# Patient Record
Sex: Male | Born: 1935 | Race: White | Hispanic: No | Marital: Married | Smoking: Never smoker
Health system: Southern US, Community
[De-identification: ages and names within clinical notes are randomized; demographics above are authoritative.]

## PROBLEM LIST (undated history)

## (undated) DIAGNOSIS — R413 Other amnesia: Secondary | ICD-10-CM

## (undated) DIAGNOSIS — H9190 Unspecified hearing loss, unspecified ear: Secondary | ICD-10-CM

## (undated) DIAGNOSIS — N200 Calculus of kidney: Secondary | ICD-10-CM

## (undated) DIAGNOSIS — M4807 Spinal stenosis, lumbosacral region: Secondary | ICD-10-CM

## (undated) DIAGNOSIS — F0280 Dementia in other diseases classified elsewhere without behavioral disturbance: Secondary | ICD-10-CM

## (undated) DIAGNOSIS — G309 Alzheimer's disease, unspecified: Secondary | ICD-10-CM

## (undated) DIAGNOSIS — M1990 Unspecified osteoarthritis, unspecified site: Secondary | ICD-10-CM

## (undated) DIAGNOSIS — N400 Enlarged prostate without lower urinary tract symptoms: Secondary | ICD-10-CM

## (undated) HISTORY — DX: Benign prostatic hyperplasia without lower urinary tract symptoms: N40.0

## (undated) HISTORY — PX: LITHOTRIPSY: SUR834

## (undated) HISTORY — DX: Unspecified hearing loss, unspecified ear: H91.90

## (undated) HISTORY — PX: CATARACT EXTRACTION: SUR2

## (undated) HISTORY — DX: Calculus of kidney: N20.0

## (undated) HISTORY — DX: Other amnesia: R41.3

## (undated) HISTORY — PX: BACK SURGERY: SHX140

## (undated) HISTORY — DX: Spinal stenosis, lumbosacral region: M48.07

## (undated) HISTORY — PX: POLYPECTOMY: SHX149

---

## 2001-01-07 ENCOUNTER — Other Ambulatory Visit: Admission: RE | Admit: 2001-01-07 | Discharge: 2001-01-07 | Payer: Self-pay | Admitting: Dermatology

## 2001-09-13 ENCOUNTER — Ambulatory Visit (HOSPITAL_COMMUNITY): Admission: RE | Admit: 2001-09-13 | Discharge: 2001-09-13 | Payer: Self-pay | Admitting: Internal Medicine

## 2003-04-20 ENCOUNTER — Other Ambulatory Visit: Admission: RE | Admit: 2003-04-20 | Discharge: 2003-04-20 | Payer: Self-pay | Admitting: Dermatology

## 2004-02-05 ENCOUNTER — Ambulatory Visit (HOSPITAL_COMMUNITY): Admission: RE | Admit: 2004-02-05 | Discharge: 2004-02-05 | Payer: Self-pay | Admitting: Pulmonary Disease

## 2004-03-13 ENCOUNTER — Ambulatory Visit (HOSPITAL_COMMUNITY): Admission: RE | Admit: 2004-03-13 | Discharge: 2004-03-13 | Payer: Self-pay | Admitting: Family Medicine

## 2004-04-16 ENCOUNTER — Ambulatory Visit (HOSPITAL_COMMUNITY): Admission: RE | Admit: 2004-04-16 | Discharge: 2004-04-16 | Payer: Self-pay | Admitting: Family Medicine

## 2004-04-25 ENCOUNTER — Ambulatory Visit (HOSPITAL_COMMUNITY): Admission: RE | Admit: 2004-04-25 | Discharge: 2004-04-25 | Payer: Self-pay | Admitting: Urology

## 2005-07-21 ENCOUNTER — Emergency Department (HOSPITAL_COMMUNITY): Admission: EM | Admit: 2005-07-21 | Discharge: 2005-07-21 | Payer: Self-pay | Admitting: Emergency Medicine

## 2005-07-23 ENCOUNTER — Ambulatory Visit (HOSPITAL_COMMUNITY): Admission: RE | Admit: 2005-07-23 | Discharge: 2005-07-23 | Payer: Self-pay | Admitting: Emergency Medicine

## 2005-08-07 ENCOUNTER — Inpatient Hospital Stay (HOSPITAL_COMMUNITY): Admission: RE | Admit: 2005-08-07 | Discharge: 2005-08-08 | Payer: Self-pay | Admitting: Neurosurgery

## 2006-04-05 ENCOUNTER — Emergency Department (HOSPITAL_COMMUNITY): Admission: EM | Admit: 2006-04-05 | Discharge: 2006-04-06 | Payer: Self-pay | Admitting: Emergency Medicine

## 2006-04-06 ENCOUNTER — Ambulatory Visit (HOSPITAL_COMMUNITY): Admission: RE | Admit: 2006-04-06 | Discharge: 2006-04-06 | Payer: Self-pay | Admitting: Internal Medicine

## 2006-04-08 ENCOUNTER — Ambulatory Visit (HOSPITAL_COMMUNITY): Admission: RE | Admit: 2006-04-08 | Discharge: 2006-04-08 | Payer: Self-pay | Admitting: Internal Medicine

## 2007-12-23 ENCOUNTER — Ambulatory Visit (HOSPITAL_COMMUNITY): Admission: RE | Admit: 2007-12-23 | Discharge: 2007-12-23 | Payer: Self-pay | Admitting: Family Medicine

## 2008-07-04 ENCOUNTER — Ambulatory Visit (HOSPITAL_COMMUNITY): Admission: RE | Admit: 2008-07-04 | Discharge: 2008-07-04 | Payer: Self-pay | Admitting: Ophthalmology

## 2010-09-23 LAB — BASIC METABOLIC PANEL
BUN: 15 mg/dL (ref 6–23)
CO2: 29 mEq/L (ref 19–32)
Calcium: 9.2 mg/dL (ref 8.4–10.5)
Chloride: 103 mEq/L (ref 96–112)
Creatinine, Ser: 0.95 mg/dL (ref 0.4–1.5)
GFR calc Af Amer: >60 mL/min (ref >60)
GFR calc non Af Amer: >60 mL/min (ref >60)
Glucose, Bld: 95 mg/dL (ref 70–99)
Potassium: 4.6 mEq/L (ref 3.5–5.1)
Sodium: 139 mEq/L (ref 135–145)

## 2010-09-23 LAB — HEMOGLOBIN AND HEMATOCRIT, BLOOD
HCT: 42 % (ref 39.0–52.0)
Hemoglobin: 14 g/dL (ref 13.0–17.0)

## 2010-10-25 NOTE — Op Note (Signed)
Noah Bailey, Noah Bailey             ACCOUNT NO.:  [id number]   MEDICAL RECORD NO.:  [id number]          PATIENT TYPE:  INP   LOCATION:  3008                         FACILITY:  [facility]   PHYSICIAN:  Hewitt Shorts, M.D.DATE OF BIRTH:  10-04-1928   DATE OF PROCEDURE:  08/07/2005  DATE OF DISCHARGE:                                 OPERATIVE REPORT   PREOPERATIVE DIAGNOSIS:  C6-7 cervical disk herniation, cervical  spondylosis, cervical degenerative disk disease, and cervical radiculopathy.   POSTOPERATIVE DIAGNOSIS:  C6-7 cervical disk herniation, cervical  spondylosis, cervical degenerative disk disease, and cervical radiculopathy.   OPERATION PERFORMED:  C6-7 anterior cervical diskectomy and arthrodesis with  VG2 allograft and tether cervical plating.   SURGEON:  Hewitt Shorts, M.D.   ASSISTANT:  Stefani Dama, M.D.   ANESTHESIA:  General endotracheal.   INDICATIONS FOR PROCEDURE:  The patient is a 77 year old man, who presented  with an acute right C7 cervical radiculopathy with right triceps and  intrinsic weakness.  MRI scan revealed multilevel degenerative disk disease  and spondylosis but with a right C6-7 soft cervical disk herniation.  Decision was made to proceed with a single level anterior cervical  diskectomy and arthrodesis.   DESCRIPTION OF PROCEDURE:  The patient was brought to the operating room and  placed under general endotracheal anesthesia.  The patient was placed in 10  pounds of halter traction.  The neck was prepped with Betadine soap and  solution and draped in sterile fashion.  A horizontal incision was made on  the left side of the neck.  The line of the incision was infiltrated with  local anesthetic and epinephrine.  Incision was made and was carried down  through the subcutaneous tissue and platysma with bipolar cautery and  electrocautery used to maintain hemostasis.  Dissection was carried down to  an avascular plane, leaving the  sternocleidomastoid, carotid artery and  jugular vein laterally and trachea and esophagus medially.  Then the ventral  aspect of the vertebral column identified and a localizing x-ray was taken.  The C6-7 intervertebral disk space was identified.  The anterior aspect of  the annulus was incised and the entry to the disk space proceeded with a  thorough diskectomy using  variety of pituitary rongeurs and microcurets.  The cartilaginous end plates of the corresponding vertebrae were removed  using microcurets and the X-Max drill.  Then the microscope was draped and  brought into the field to provide additional magnification, illumination and  visualization and the remainder of the decompression was performed using  microdissection and microsurgical technique. Posterior osteophytic  overgrowth was removed using the X-Max drill and a 2 mm Kerrison punch with  a thin foot plate.  The posterior longitudinal ligament which was thickened  was carefully removed and the disk herniation on the right side at C6-7 was  removed.  We decompressed the spinal canal and thecal sac as well as the  foramina and exiting nerve roots.  Once the decompression was completed,  hemostasis was established with the use of Gelfoam soaked in Thrombin.  We  did remove all the  Gelfoam, though, prior to proceeding with the  arthrodesis.  We measured the height of the intervertebral disk space using  disk space sizers and selected an 8 mm VG2 allograft.  It was hydrated in  saline solution and positioned in the intervertebral disk space and  countersunk.  We then selected a 16 mm tether cervical plate and it was  secured to each of the vertebrae with a pair of 4.0 x 14 mm screws.  Each  screw hole was drilled and tapped and the screws were placed in alternating  fashion. Final tightening was done of all four screws and then an x-ray was  taken which showed the screws in good position at C6 as well as the graft in  good  position at the C6-7 level.  Because of his shoulders, we could not  visualize the C7 level.  The wound was irrigated with bacitracin solution as  it had been throughout the procedure and we confirmed hemostasis and then  proceeded with closure.  The platysma was closed with interrupted inverted 2-  0 undyed Vicryl sutures.  Subcutaneous and subcuticular layer were closed  with interrupted inverted 3-0 undyed Vicryl sutures and the skin edges were  approximated with DermaBond.  The patient tolerated the procedure well.  The  estimated blood loss was 50 mL.  Sponge, needle and instrument counts were  correct.  Following surgery, the patient was placed in a soft cervical  collar to be reversed from his anesthetic, extubated and transferred to the  recovery room for further care.      Hewitt Shorts, M.D.  Electronically Signed     RWN/MEDQ  D:  08/07/2005  T:  08/08/2005  Job:  [id number]

## 2010-10-25 NOTE — Procedures (Signed)
Noah Bailey, Noah Bailey             ACCOUNT NO.:  [id number]   MEDICAL RECORD NO.:  [id number]          PATIENT TYPE:  OUT   LOCATION:  RAD                           FACILITY:  [facility]   PHYSICIAN:  Dani Gobble, MD       DATE OF BIRTH:  [birth year]   DATE OF PROCEDURE:  04/08/2006  DATE OF DISCHARGE:                                  ECHOCARDIOGRAM   REFERRING:  1. Dr. Sherwood Gambler.  2. Dr. Domingo Sep.   INDICATIONS:  A 77 year old gentleman with a past medical history of double  vision and possible stroke, who is referred for evaluation of LV function.   FINDINGS:  The technical quality of the study is reasonable.   The aorta measures normally at 3.2 cm.   The left atrium is mildly dilated and measured 4.3 cm.  The patient appeared  to be in sinus rhythm during the procedure.  No obvious clots or masses were  appreciated.   The interventricular septum measures 1.6 cm and the posterior wall measures  4.2 cm.   The aortic valve is trileaflet and mildly thickened, but without limitation  to leaflet excursion.  Mild aortic insufficiency is noted.  Doppler  interrogation of the aortic valve reveals a peak velocity of 1.6 meters per  second, corresponding to a peak gradient of 10 mmHg and a mean gradient of 6  mmHg and an area by Doppler of 2.7 cm2.   The mitral valve also appears mildly thickened, but without limitation to  leaflet excursion.  No mitral valve prolapse is noted.  Mild mitral  regurgitation is noted.  Doppler interrogation of mitral valve is within  normal limits.   The pulmonic valve is poorly visualized.   The tricuspid valve appears grossly structurally normal with mild to  moderate tricuspid regurgitation noted.  Doppler interrogation of the  tricuspid regurgitation does not suggest pulmonary hypertension.   The left ventricle is normal in size with LVIDd measured at 3.7 cm and LVISd  measured at 2.7 cm.  Overall left systolic function is normal and no  regional wall  motion abnormalities are noted.  There is basal septal  hypertrophy, as is common in the elderly, as an overlay to his concentric  LVH.  There does not appear to be a resting LVOT gradient.  The presence of  diastolic dysfunction is inferred from pulse wave Doppler across the mitral  valve.   The right atrium appears normal in size while the right ventricle is  moderate to markedly dilated with preserved right ventricular systolic  function.   IMPRESSION:  1. Mild left atrial enlargement.  2. Mild concentric left ventricular hypertrophy with additional basal      septal hypertrophy overlay.  No resting left ventricular outflow tract      gradient is appreciated.  3. Mild aortic sclerosis without stenosis.  4. Mild aortic insufficiency.  5. Mild mitral regurgitation.  6. Mild to moderate tricuspid regurgitation without evidence for pulmonary      hypertension.  7. Normal left ventricular size and systolic function without regional      wall motion abnormality  noted.  8. Presence of diastolic dysfunction is inferred from pulse wave Doppler      across the mitral valve.  9. Moderate to marked right ventricular enlargement with preserved right      ventricular systolic function.           ______________________________  Dani Gobble, MD     AB/MEDQ  D:  04/08/2006  T:  04/09/2006  Job:  [id number]   cc:   Madelin Rear. Sherwood Gambler, MD  Fax: [phone]

## 2011-01-26 ENCOUNTER — Other Ambulatory Visit: Payer: Self-pay

## 2011-01-26 ENCOUNTER — Encounter: Payer: Self-pay | Admitting: Emergency Medicine

## 2011-01-26 ENCOUNTER — Emergency Department (HOSPITAL_COMMUNITY): Payer: Medicare Other

## 2011-01-26 ENCOUNTER — Emergency Department (HOSPITAL_COMMUNITY)
Admission: EM | Admit: 2011-01-26 | Discharge: 2011-01-26 | Disposition: A | Discharge status: Home / Self Care | Payer: Medicare Other | Attending: Emergency Medicine | Admitting: Emergency Medicine

## 2011-01-26 DIAGNOSIS — H8110 Benign paroxysmal vertigo, unspecified ear: Secondary | ICD-10-CM | POA: Insufficient documentation

## 2011-01-26 HISTORY — DX: Unspecified osteoarthritis, unspecified site: M19.90

## 2011-01-26 LAB — BASIC METABOLIC PANEL
BUN: 13 mg/dL (ref 6–23)
CO2: 27 mEq/L (ref 19–32)
Chloride: 104 mEq/L (ref 96–112)
GFR calc Af Amer: >60 mL/min (ref >60)
Glucose, Bld: 135 mg/dL — ABNORMAL HIGH (ref 70–99)
Potassium: 3.6 mEq/L (ref 3.5–5.1)

## 2011-01-26 LAB — URINALYSIS, ROUTINE W REFLEX MICROSCOPIC
Bilirubin Urine: NEGATIVE
Hgb urine dipstick: NEGATIVE
Nitrite: NEGATIVE
Specific Gravity, Urine: 1.02 (ref 1.005–1.030)
pH: 7.5 (ref 5.0–8.0)

## 2011-01-26 LAB — CBC
HCT: 37.9 % — ABNORMAL LOW (ref 39.0–52.0)
Hemoglobin: 12.9 g/dL — ABNORMAL LOW (ref 13.0–17.0)
MCH: 31.4 pg (ref 26.0–34.0)
MCHC: 34 g/dL (ref 30.0–36.0)
MCV: 92.2 fL (ref 78.0–100.0)
Platelets: 189 10*3/uL (ref 150–400)
RBC: 4.11 MIL/uL — ABNORMAL LOW (ref 4.22–5.81)
RDW: 13.4 % (ref 11.5–15.5)
WBC: 4.3 10*3/uL (ref 4.0–10.5)

## 2011-01-26 LAB — DIFFERENTIAL
Basophils Absolute: 0 10*3/uL (ref 0.0–0.1)
Basophils Relative: 0 % (ref 0–1)
Eosinophils Absolute: 0 10*3/uL (ref 0.0–0.7)
Eosinophils Relative: 0 % (ref 0–5)
Lymphocytes Relative: 20 % (ref 12–46)
Lymphs Abs: 0.8 10*3/uL (ref 0.7–4.0)
Monocytes Absolute: 0.2 10*3/uL (ref 0.1–1.0)
Monocytes Relative: 4 % (ref 3–12)
Neutro Abs: 3.2 10*3/uL (ref 1.7–7.7)
Neutrophils Relative %: 76 % (ref 43–77)

## 2011-01-26 MED ORDER — MECLIZINE HCL 12.5 MG PO TABS
12.5000 mg | ORAL_TABLET | Freq: Once | ORAL | Status: AC
  Administered 2011-01-26: 12.5 mg via ORAL
  Filled 2011-01-26: qty 1

## 2011-01-26 NOTE — ED Notes (Signed)
Orthostatic Vital Signs: Completed by EMS PTA Lying:  BP: 120/90        Sitting: BP: 150/80        Standing:  BP: 154/90

## 2011-01-26 NOTE — ED Provider Notes (Signed)
[id number] job #

## 2011-01-26 NOTE — ED Notes (Signed)
Meal tray ordered for patient.

## 2011-01-26 NOTE — ED Notes (Signed)
Patient placed on continuous cardiac monitoring, continuous pulse oximetry, and NBP cycling q 1 hour.   

## 2011-01-26 NOTE — ED Notes (Signed)
Orthostatic Vital Signs:  Lying:  BP: 133/77        Pulse: 74  Sitting: BP: 147/72       Pulse: 79  Standing:  BP: 144/66          Pulse: 82

## 2011-01-26 NOTE — ED Notes (Signed)
Patient arrives via EMS with c/o dizziness that started at approximately at 0400. Patient also reporting LLQ abdominal pain that started yesterday evening. Denies nausea/vomiting. Patient alert/oriented x 4.

## 2011-01-26 NOTE — ED Notes (Signed)
Patient with no complaints at this time. Respirations even and unlabored. Skin warm/dry. Discharge instructions reviewed with patient at this time. Patient given opportunity to voice concerns/ask questions. IV removed per policy and band-aid applied to site. Patient discharged at this time and left Emergency Department with steady gait.  

## 2011-01-26 NOTE — Progress Notes (Signed)
Noah Bailey, Noah Bailey             ACCOUNT NO.:  [id number]  MEDICAL RECORD NO.:  [id number]  LOCATION:  APA14                         FACILITY:  [facility]  PHYSICIAN:  Mila Homer. [name], M.D.DATE OF BIRTH:  June 18, 1928  DATE OF PROCEDURE: DATE OF DISCHARGE:  01/26/2011                                PROGRESS NOTE   SUBJECTIVE:  This 77 year old woke up last night with the room spinning around him as he went to the bathroom.  Things stabilized somewhat once he was up, but he was still dizzy and had to hold onto the walls to avoid falling.  He presented to the [facility] Emergency Room today for evaluation.  He has not had these symptoms before.  He is currently on Flomax 0.4 mg daily but has been on this drug for years without a problem.  He tends to run low blood pressure.  When the ambulance came out to his house, they ran postural signs which by history were normal.  He has had no weakness, no problems with his speech and no problems with swallowing.  He is generally healthy and is followed by Dr. Shaune Pollack, his local MD.  OBJECTIVE:  VITAL SIGNS:  His temperature is 97.7, pulse 70, rest rate 18, blood pressure 127/78, O2 saturation 95%. GENERAL:  He is supine in bed.  His speech is normal, and he is oriented and alert.  His wife and son are with him in the room in the emergency department.  His color is good. HEART:  His heart has a regular rhythm without murmur, rate of about 80. LUNGS:  His lungs are clear throughout.  He is moving air well. NEUROLOGIC:  He has 5/5 grip strength bilaterally.  His smile is symmetrical.  He has bilateral hearing aids, but I removed these and examined his ear canals and drums.  Both of these were normal.  Good light reflexes.  No cerumen.  Again, his speech was totally normal.  A CT of his brain showed no acute intracranial pathology but chronic ischemic changes and mild global atrophy appropriate to age.  He had minimal  mucus material in the left maxillary sinus.  His white cell count is 4300, hemoglobin 12.9.  CMP was normal.  Glucose is 135.  UA negative.  I went to sit him up, which is how he tends to get dizzy, and after meclizine he seemed to be doing better.  He had no vertical or horizontal nystagmus noted.  ASSESSMENT: 1. Vertigo. 2. Question transient ischemic attack versus cerebellar infarct. 3. Benign prostatic hypertrophy.  PLAN:  I discussed all this with his wife and the patient at length.  He really would like to go home and feels much better at this point.  I wrote a prescription for meclizine 25 mg (1/2 to 1 tablet q.i.d.) #30 with 1 refill to use today and tomorrow.  He is to call the office of Dr. Juanetta Gosling tomorrow to get follow-up there.  If his symptoms worsen, he may need further radiological evaluation including MRI of the brain.  We discussed other causes of dizziness such as benign positional vertigo.     Mila Homer. [name], M.D.  SDK/MEDQ  D:  01/26/2011  T:  01/26/2011  Job:  [id number]

## 2011-01-26 NOTE — ED Provider Notes (Signed)
History     CSN: [id number] Arrival date & time: 01/26/2011  8:53 AM  Chief Complaint  Patient presents with  . Dizziness   Patient is a 77 y.o. male presenting with neurologic complaint. The history is provided by the patient and the spouse.  Neurologic Problem The primary symptoms include dizziness. Primary symptoms do not include headaches, fever or nausea. The symptoms began 2 to 6 hours ago. The symptoms are improving. The neurological symptoms are diffuse. The symptoms occurred after standing up.  Dizziness does not occur with tinnitus, nausea or weakness.  Additional symptoms include loss of balance. Additional symptoms do not include neck stiffness, weakness, leg pain, photophobia, nystagmus or tinnitus.    Past Medical History  Diagnosis Date  . Arthritis     Past Surgical History  Procedure Date  . Polypectomy     History reviewed. No pertinent family history.  History  Substance Use Topics  . Smoking status: Never Smoker   . Smokeless tobacco: Not on file  . Alcohol Use: No      Review of Systems  Constitutional: Negative for fever.  HENT: Negative for congestion, sore throat, neck pain, neck stiffness and tinnitus.   Eyes: Negative.  Negative for photophobia.  Respiratory: Negative for chest tightness and shortness of breath.   Cardiovascular: Negative for chest pain.  Gastrointestinal: Negative for nausea and abdominal pain.  Genitourinary: Negative.   Musculoskeletal: Negative for joint swelling and arthralgias.  Skin: Negative.  Negative for rash and wound.  Neurological: Positive for dizziness and loss of balance. Negative for weakness, light-headedness, numbness and headaches.  Hematological: Negative.   Psychiatric/Behavioral: Negative.     Physical Exam  BP 127/78  Pulse 70  Temp(Src) 97.7 F (36.5 C) (Oral)  Resp 18  Ht 5\' 10"  (1.778 m)  Wt 190 lb (86.183 kg)  BMI 27.26 kg/m2  SpO2 95%  Physical Exam  Nursing note and vitals  reviewed. Constitutional: He is oriented to person, place, and time. He appears well-developed and well-nourished.  HENT:  Head: Normocephalic and atraumatic.  Mouth/Throat: Oropharynx is clear and moist.  Eyes: EOM are normal. Pupils are equal, round, and reactive to light.  Neck: Normal range of motion. Neck supple.  Cardiovascular: Normal rate and normal heart sounds.   Pulmonary/Chest: Effort normal.  Abdominal: Soft. There is no tenderness.  Musculoskeletal: Normal range of motion.  Lymphadenopathy:    He has no cervical adenopathy.  Neurological: He is alert and oriented to person, place, and time. He has normal strength. No cranial nerve deficit or sensory deficit. He displays a negative Romberg sign. Coordination normal. GCS eye subscore is 4. GCS verbal subscore is 5. GCS motor subscore is 6.       Normal heel-shin, normal rapid alternating movements.  Skin: Skin is warm and dry. No rash noted.  Psychiatric: He has a normal mood and affect. His speech is normal and behavior is normal. Thought content normal. Cognition and memory are normal.    ED Course  Procedures  MDM Discussed with Dr. [name] who suggested trial of meclizine.  Will see in ed in consult.  Meclizine ordered.  Evaluated by Dr. [name] who has decided to dc pt home with close f/u.  Prescription for meclizine given pt by Dr [name].   Date: 01/26/2011  Rate: 72  Rhythm: normal sinus rhythm  QRS Axis: left  Intervals: normal  ST/T Wave abnormalities: nonspecific ST/T changes  Conduction Disutrbances:none  Narrative Interpretation: lvh.  Q waves in inferior  leads unchanged  Old EKG Reviewed: No sig changes noted.        Candis Musa, PA 01/26/11 1606  Medical screening examination/treatment/procedure(s) were conducted as a shared visit with non-physician practitioner(s) and myself.  I personally evaluated the patient during the encounter Pt seen and examined, pt with dizziness improving upon my  evaluation.  Ambulated to bathroom without difficulty.  Dr. [name] has seen patient in the ED, gave prescription for meclizine and has discharge. Will see patient in office for follow up.   Ethelda Chick, MD 01/26/11 [phone]

## 2011-01-26 NOTE — ED Notes (Signed)
Urine specimen obtained at this time.

## 2011-01-26 NOTE — ED Notes (Signed)
Patient reports dizziness symptoms were so he couldn't get out of bed this morning. Patient denies cardiac history.

## 2011-03-27 ENCOUNTER — Other Ambulatory Visit (HOSPITAL_COMMUNITY): Payer: Self-pay | Admitting: Pulmonary Disease

## 2011-03-27 ENCOUNTER — Ambulatory Visit (HOSPITAL_COMMUNITY)
Admission: RE | Admit: 2011-03-27 | Discharge: 2011-03-27 | Disposition: A | Discharge status: Home / Self Care | Payer: Medicare Other | Source: Ambulatory Visit | Attending: Pulmonary Disease | Admitting: Pulmonary Disease

## 2011-03-27 DIAGNOSIS — M25469 Effusion, unspecified knee: Secondary | ICD-10-CM | POA: Insufficient documentation

## 2011-03-27 DIAGNOSIS — M25562 Pain in left knee: Secondary | ICD-10-CM

## 2011-03-27 DIAGNOSIS — M25569 Pain in unspecified knee: Secondary | ICD-10-CM | POA: Insufficient documentation

## 2011-04-02 ENCOUNTER — Other Ambulatory Visit (HOSPITAL_COMMUNITY): Payer: Self-pay | Admitting: Pulmonary Disease

## 2011-04-02 DIAGNOSIS — R413 Other amnesia: Secondary | ICD-10-CM

## 2011-04-03 ENCOUNTER — Ambulatory Visit (HOSPITAL_COMMUNITY)
Admission: RE | Admit: 2011-04-03 | Discharge: 2011-04-03 | Disposition: A | Discharge status: Home / Self Care | Payer: Medicare Other | Source: Ambulatory Visit | Attending: Pulmonary Disease | Admitting: Pulmonary Disease

## 2011-04-03 DIAGNOSIS — G319 Degenerative disease of nervous system, unspecified: Secondary | ICD-10-CM | POA: Insufficient documentation

## 2011-04-03 DIAGNOSIS — R413 Other amnesia: Secondary | ICD-10-CM | POA: Insufficient documentation

## 2011-04-29 ENCOUNTER — Other Ambulatory Visit (HOSPITAL_COMMUNITY): Payer: Self-pay | Admitting: Urology

## 2011-04-29 ENCOUNTER — Ambulatory Visit (HOSPITAL_COMMUNITY)
Admission: RE | Admit: 2011-04-29 | Discharge: 2011-04-29 | Disposition: A | Discharge status: Home / Self Care | Payer: Medicare Other | Source: Ambulatory Visit | Attending: Urology | Admitting: Urology

## 2011-04-29 DIAGNOSIS — R109 Unspecified abdominal pain: Secondary | ICD-10-CM | POA: Insufficient documentation

## 2011-04-29 DIAGNOSIS — N200 Calculus of kidney: Secondary | ICD-10-CM

## 2011-06-11 ENCOUNTER — Ambulatory Visit (HOSPITAL_COMMUNITY)
Admission: RE | Admit: 2011-06-11 | Discharge: 2011-06-11 | Disposition: A | Discharge status: Home / Self Care | Payer: Medicare Other | Source: Ambulatory Visit | Attending: Urology | Admitting: Urology

## 2011-06-11 ENCOUNTER — Other Ambulatory Visit (HOSPITAL_COMMUNITY): Payer: Self-pay | Admitting: Urology

## 2011-06-11 DIAGNOSIS — N200 Calculus of kidney: Secondary | ICD-10-CM

## 2011-06-11 DIAGNOSIS — Z09 Encounter for follow-up examination after completed treatment for conditions other than malignant neoplasm: Secondary | ICD-10-CM | POA: Insufficient documentation

## 2011-06-19 ENCOUNTER — Encounter: Payer: Self-pay | Admitting: Orthopedic Surgery

## 2011-06-19 ENCOUNTER — Ambulatory Visit (INDEPENDENT_AMBULATORY_CARE_PROVIDER_SITE_OTHER): Payer: Medicare Other | Admitting: Orthopedic Surgery

## 2011-06-19 VITALS — BP 130/70 | Ht 70.0 in | Wt 194.0 lb

## 2011-06-19 DIAGNOSIS — M1710 Unilateral primary osteoarthritis, unspecified knee: Secondary | ICD-10-CM

## 2011-06-19 NOTE — Progress Notes (Signed)
Patient ID: Noah Bailey, male   DOB: 1929-05-18, 78 y.o.   MRN: [id number] x Subjective:    Noah Bailey is a 78 y.o. male who presents with knee pain involving the left knee. Onset was gradual. Inciting event: none known. Current symptoms include: pain located medial joint line, stiffness and swelling. Pain is aggravated by any weight bearing, going up and down stairs, rising after sitting, standing, walking and general activity. Patient has had no prior knee problems. Evaluation to date: plain films: degenerative joint disease mild to moderate. Treatment to date: none.  The following portions of the patient's history were reviewed and updated as appropriate: allergies, current medications, past family history, past medical history, past social history, past surgical history and problem list.   Review of Systems A comprehensive review of systems was negative except for: blood and urine related to her recent kidney stones   Objective:    BP 130/70  Ht 5\' 10"  (1.778 m)  Wt 194 lb (87.998 kg)  BMI 27.84 kg/m2  Physical Exam(12) GENERAL: normal development   CDV: pulses are normal   Skin: normal  Lymph: nodes were not palpable/normal  Psychiatric: awake, alert and oriented  Neuro: normal sensation  Upper extremity exam  Inspection and palpation revealed no abnormalities in the upper extremities.  Range of motion is full without contracture.  Motor exam is normal with grade 5 strength.  The joints are fully reduced without subluxation.  There is no atrophy or tremor and muscle tone is normal.  All joints are stable.   Right knee: normal and no effusion, full active range of motion, no joint line tenderness, ligamentous structures intact.  Left knee:  positive exam findings: medial joint line tenderness, tenderness noted under the patella at the patellar tendon and along the patellar tendon and negative exam findings: ACL stable, PCL stable, MCL stable, LCL stable, no  patellar laxity, McMurray's negative and hyperflexion test caused some pain but it was anterior.  Screw test was normal.  McMurray sign was in between.   X-ray left knee: shows DJD changes, likely chronic    Assessment:    Right arthritis    Plan:    OTC analgesics as needed. Arthrocentesis. See procedure note.   Knee  Injection Procedure Note  Pre-operative Diagnosis: left knee oa djd  Post-operative Diagnosis: same  Indications: pain   Anesthesia: not required none  Procedure Details   Verbal consent was obtained for the procedure. Time out was completed.The joint was prepped with alcohol, followed by  Ethyl chloride spray and A 20 gauge needle was inserted into the knee via lateral approach; 4ml 1% lidocaine and 1 ml of depomedrol  was then injected into the joint . The needle was removed and the area cleansed and dressed.  Complications:  None; patient tolerated the procedure well.  Complications:  None; patient tolerated the procedure well.

## 2011-06-19 NOTE — Patient Instructions (Signed)
You have received a steroid shot. 15% of patients experience increased pain at the injection site with in the next 24 hours. This is best treated with ice and tylenol extra strength 2 tabs every 8 hours. If you are still having pain please call the office.   For pain take 2 extra sterngth tylenol 3 x a day

## 2011-07-03 ENCOUNTER — Ambulatory Visit: Payer: Medicare Other | Admitting: Orthopedic Surgery

## 2011-07-18 ENCOUNTER — Telehealth: Payer: Self-pay | Admitting: *Deleted

## 2011-07-20 NOTE — Telephone Encounter (Signed)
Call in ultracet 1 q 6 hrs prn pain   # 40 2 refills

## 2011-07-21 MED ORDER — TRAMADOL-ACETAMINOPHEN 37.5-325 MG PO TABS: 1.0000 | ORAL_TABLET | Freq: Four times a day (QID) | ORAL | Status: AC | PRN

## 2011-07-21 NOTE — Telephone Encounter (Signed)
Medication sent, patient aware.  

## 2011-09-30 ENCOUNTER — Ambulatory Visit (INDEPENDENT_AMBULATORY_CARE_PROVIDER_SITE_OTHER): Payer: Medicare Other | Admitting: Orthopedic Surgery

## 2011-09-30 ENCOUNTER — Encounter: Payer: Self-pay | Admitting: Orthopedic Surgery

## 2011-09-30 VITALS — BP 124/70 | Ht 70.0 in | Wt 193.0 lb

## 2011-09-30 DIAGNOSIS — M6530 Trigger finger, unspecified finger: Secondary | ICD-10-CM | POA: Insufficient documentation

## 2011-09-30 DIAGNOSIS — M1710 Unilateral primary osteoarthritis, unspecified knee: Secondary | ICD-10-CM

## 2011-09-30 NOTE — Progress Notes (Signed)
Patient ID: Noah Bailey, male   DOB: 1929-04-10, 78 y.o.   MRN: [id number] Chief Complaint  Patient presents with  . Knee Pain    left knee pain, requests injection, also left ring finger trigger finger   Problem is triggering of the LEFT ring finger.  On problem of osteoarthritis, LEFT knee. Last evaluated January of 2013  Complains of triggering of the LEFT long finger with locking and catching and pain over the A1 pulley.  The knee has improved 100% but he would like another injection to get rid of the last visit. The pain that he is having.  His review of systems is negative.  The LEFT ring finger is tender over the A1 pulley full flexion is noted. No weakness is noted. The skins intact. There is no rash. There is no redness. There is catching no locking. There is swelling over the. The pulse and temperature of the digit are normal.  Problem #1 A1 pulley/tenosynovitis with triggering.  Recommend injection.  Problem #2 osteoarthritis of the LEFT knee.  Recommend injection

## 2011-09-30 NOTE — Patient Instructions (Signed)
You have received a steroid shot. 15% of patients experience increased pain at the injection site with in the next 24 hours. This is best treated with ice and tylenol extra strength 2 tabs every 8 hours. If you are still having pain please call the office.    

## 2012-08-05 ENCOUNTER — Other Ambulatory Visit (HOSPITAL_COMMUNITY): Payer: Self-pay | Admitting: Pulmonary Disease

## 2012-08-05 DIAGNOSIS — I739 Peripheral vascular disease, unspecified: Secondary | ICD-10-CM

## 2012-08-06 ENCOUNTER — Ambulatory Visit (HOSPITAL_COMMUNITY)
Admission: RE | Admit: 2012-08-06 | Discharge: 2012-08-06 | Disposition: A | Discharge status: Home / Self Care | Payer: Medicare Other | Source: Ambulatory Visit | Attending: Pulmonary Disease | Admitting: Pulmonary Disease

## 2012-08-06 DIAGNOSIS — I739 Peripheral vascular disease, unspecified: Secondary | ICD-10-CM | POA: Insufficient documentation

## 2012-12-14 ENCOUNTER — Ambulatory Visit: Payer: Medicare Other | Admitting: Orthopedic Surgery

## 2012-12-14 ENCOUNTER — Ambulatory Visit (INDEPENDENT_AMBULATORY_CARE_PROVIDER_SITE_OTHER): Payer: Medicare Other | Admitting: Orthopedic Surgery

## 2012-12-14 VITALS — BP 122/76 | Ht 70.0 in | Wt 195.0 lb

## 2012-12-14 DIAGNOSIS — M1710 Unilateral primary osteoarthritis, unspecified knee: Secondary | ICD-10-CM

## 2012-12-14 NOTE — Progress Notes (Signed)
Patient ID: Noah Bailey, male   DOB: Aug 20, 1928, 79 y.o.   MRN: [id number] Chief Complaint  Patient presents with  . Follow-up    Recheck left knee. Requesting injection.    Knee  Injection Procedure Note  Pre-operative Diagnosis: left knee oa  Post-operative Diagnosis: same  Indications: pain  Anesthesia: ethyl chloride   Procedure Details   Verbal consent was obtained for the procedure. Time out was completed.The joint was prepped with alcohol, followed by  Ethyl chloride spray and A 20 gauge needle was inserted into the knee via lateral approach; 4ml 1% lidocaine and 1 ml of depomedrol  was then injected into the joint . The needle was removed and the area cleansed and dressed.  Complications:  None; patient tolerated the procedure well. Knee  Injection Procedure Note  Pre-operative Diagnosis: right knee oa  Post-operative Diagnosis: same  Indications: pain  Anesthesia: ethyl chloride   Procedure Details   Verbal consent was obtained for the procedure. Time out was completed.The joint was prepped with alcohol, followed by  Ethyl chloride spray and A 20 gauge needle was inserted into the knee via lateral approach; 4ml 1% lidocaine and 1 ml of depomedrol  was then injected into the joint . The needle was removed and the area cleansed and dressed.  Complications:  None; patient tolerated the procedure well.

## 2012-12-14 NOTE — Patient Instructions (Signed)
You have received a steroid shot. 15% of patients experience increased pain at the injection site with in the next 24 hours. This is best treated with ice and tylenol extra strength 2 tabs every 8 hours. If you are still having pain please call the office.    

## 2013-01-12 ENCOUNTER — Other Ambulatory Visit (HOSPITAL_COMMUNITY): Payer: Self-pay | Admitting: Pulmonary Disease

## 2013-01-12 DIAGNOSIS — R200 Anesthesia of skin: Secondary | ICD-10-CM

## 2013-01-12 DIAGNOSIS — M545 Low back pain: Secondary | ICD-10-CM

## 2013-01-18 ENCOUNTER — Ambulatory Visit (HOSPITAL_COMMUNITY)
Admission: RE | Admit: 2013-01-18 | Discharge: 2013-01-18 | Disposition: A | Discharge status: Home / Self Care | Payer: Medicare Other | Source: Ambulatory Visit | Attending: Pulmonary Disease | Admitting: Pulmonary Disease

## 2013-01-18 DIAGNOSIS — M545 Low back pain, unspecified: Secondary | ICD-10-CM | POA: Insufficient documentation

## 2013-01-18 DIAGNOSIS — R200 Anesthesia of skin: Secondary | ICD-10-CM

## 2013-01-18 DIAGNOSIS — M5126 Other intervertebral disc displacement, lumbar region: Secondary | ICD-10-CM | POA: Insufficient documentation

## 2013-01-18 DIAGNOSIS — M79609 Pain in unspecified limb: Secondary | ICD-10-CM | POA: Insufficient documentation

## 2013-06-21 ENCOUNTER — Encounter: Payer: Self-pay | Admitting: *Deleted

## 2013-06-22 ENCOUNTER — Encounter: Payer: Self-pay | Admitting: Neurology

## 2013-06-22 ENCOUNTER — Encounter (INDEPENDENT_AMBULATORY_CARE_PROVIDER_SITE_OTHER): Payer: Self-pay | Admitting: Diagnostic Neuroimaging

## 2013-06-22 ENCOUNTER — Encounter (INDEPENDENT_AMBULATORY_CARE_PROVIDER_SITE_OTHER): Payer: Self-pay

## 2013-06-22 ENCOUNTER — Ambulatory Visit (INDEPENDENT_AMBULATORY_CARE_PROVIDER_SITE_OTHER): Payer: Medicare Other | Admitting: Neurology

## 2013-06-22 VITALS — BP 140/81 | HR 102 | Ht 68.0 in | Wt 200.0 lb

## 2013-06-22 DIAGNOSIS — G63 Polyneuropathy in diseases classified elsewhere: Secondary | ICD-10-CM

## 2013-06-22 DIAGNOSIS — D518 Other vitamin B12 deficiency anemias: Secondary | ICD-10-CM

## 2013-06-22 DIAGNOSIS — G5600 Carpal tunnel syndrome, unspecified upper limb: Secondary | ICD-10-CM

## 2013-06-22 DIAGNOSIS — M48061 Spinal stenosis, lumbar region without neurogenic claudication: Secondary | ICD-10-CM

## 2013-06-22 DIAGNOSIS — R6889 Other general symptoms and signs: Secondary | ICD-10-CM

## 2013-06-22 DIAGNOSIS — Z0289 Encounter for other administrative examinations: Secondary | ICD-10-CM

## 2013-06-22 DIAGNOSIS — R209 Unspecified disturbances of skin sensation: Secondary | ICD-10-CM

## 2013-06-22 DIAGNOSIS — M4807 Spinal stenosis, lumbosacral region: Secondary | ICD-10-CM

## 2013-06-22 HISTORY — DX: Spinal stenosis, lumbosacral region: M48.07

## 2013-06-22 NOTE — Procedures (Signed)
     HISTORY:  Noah Bailey is an 80 year old gentleman with a history of back pain and numbness in the legs that dates back about 2 years. The patient has had some mild gait instability, and he is being evaluated for a possible peripheral neuropathy. Epidural injections in the back have helped the back pain, not the leg numbness.  NERVE CONDUCTION STUDIES:  Nerve conduction studies were performed on the right upper extremity. The distal motor latency for the right median nerve was prolonged, with a normal motor amplitude. The distal motor latency for the right ulnar nerve was borderline normal, with a normal motor amplitude. The F wave latencies and nerve conduction velocities for the right median and ulnar nerves were normal. The right median sensory latency is prolonged, normal for the right ulnar nerve.  Nerve conduction studies were performed on both lower extremities. The distal motor latencies for the peroneal nerves were normal bilaterally, with a low motor amplitude on the left, borderline normal on the right. Normal nerve conduction velocities for these nerves were seen. The distal motor latencies for the posterior tibial nerves were normal bilaterally, with low motor amplitudes for these nerves bilaterally. Nerve conduction velocities for these nerves were normal bilaterally. The peroneal sensory latencies were absent bilaterally. The H reflex latencies were absent bilaterally.  EMG STUDIES:  EMG study was performed on the right lower extremity:  The tibialis anterior muscle reveals 2 to 5K motor units with decreased recruitment. No fibrillations or positive waves were seen. The peroneus tertius muscle reveals 2 to 5K motor units with decreased recruitment. No fibrillations or positive waves were seen. The medial gastrocnemius muscle reveals 2 to 4K motor units with decreased recruitment. One plus fibrillations and positive waves were seen. The vastus lateralis muscle reveals 2 to  4K motor units with full recruitment. No fibrillations or positive waves were seen. The iliopsoas muscle reveals 2 to 4K motor units with full recruitment. No fibrillations or positive waves were seen. The biceps femoris muscle (long head) reveals 2 to 4K motor units with full recruitment. No fibrillations or positive waves were seen. The lumbosacral paraspinal muscles were tested at 3 levels, and revealed no abnormalities of insertional activity at all 3 levels tested. There was good relaxation.   IMPRESSION:  Nerve conduction studies done on the right upper extremity and both lower extremities shows evidence of a mild right carpal tunnel syndrome. 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 EMG evaluation of the right lower extremity shows distal primarily chronic signs of denervation that is consistent with the diagnosis of peripheral neuropathy. There does not appear to be evidence of an overlying lumbosacral radiculopathy on the right.  Jill Alexanders MD 06/22/2013 1:10 PM  [facility] [address] [place], [place] [address]  Phone [phone] Fax [phone]

## 2013-06-22 NOTE — Progress Notes (Signed)
Reason for visit: Numbness in the legs  Noah Bailey is a 80 y.o. male  History of present illness:  Mr. Noah Bailey is an 80 year old right-handed white male with a history of lumbosacral spinal stenosis at L4-5 level. The patient has moderate level spinal stenosis, and approximately 2 years ago he developed some back pain, and around the same time began having some numbness below the knees bilaterally. The patient believes there is some weakness of the legs, and he is had some mild gait instability but no falls. The patient is ambulating without any assistive device. The patient has gained some benefit with epidural steroid injections on 2 occasions with an improvement in the back pain. The numbness in the legs persists, however. The patient denies any problems controlling the bowels or the bladder. The patient denies any neck pain or pain down arms or numbness or weakness in arms. The patient is sent to this office for evaluation of a possible peripheral neuropathy. The patient indicates that the numbness has not worsened with walking, but his discomfort in the back does worsen when he is active.  Past Medical History  Diagnosis Date  . Arthritis   . Hearing deficit     right hearing aid  . Lumbosacral spinal stenosis 06/22/2013  . BPH (benign prostatic hyperplasia)   . Renal calculi   . Memory disturbance     Past Surgical History  Procedure Laterality Date  . Polypectomy    . Back surgery      Cervical spine  . Lithotripsy    . Cataract extraction Bilateral     History reviewed. No pertinent family history.  Social history:  reports that he has never smoked. He has never used smokeless tobacco. He reports that he does not drink alcohol or use illicit drugs.  Medications:  Current Outpatient Prescriptions on File Prior to Visit  Medication Sig Dispense Refill  . acetaminophen (TYLENOL) 500 MG tablet Take 500 mg by mouth every 6 (six) hours as needed. For pain       .  Multiple Vitamins-Minerals (OCUVITE ADULT 50+ PO) Take 1 tablet by mouth daily.        . Tamsulosin HCl (FLOMAX) 0.4 MG CAPS Take 0.4 mg by mouth daily.         No current facility-administered medications on file prior to visit.      Allergies  Allergen Reactions  . Ivp Dye [Iodinated Diagnostic Agents]     Unknown    ROS:  Out of a complete 14 system review of symptoms, the patient complains only of the following symptoms, and all other reviewed systems are negative.  Memory loss, numbness, weakness Low back pain  Blood pressure 140/81, pulse 102, height 5\' 8"  (1.727 m), weight 200 lb (90.719 kg).  Physical Exam  General: The patient is alert and cooperative at the time of the examination.  Eyes: Pupils are equal, round, and reactive to light. Discs are flat bilaterally.  Neck: The neck is supple, no carotid bruits are noted.  Respiratory: The respiratory examination is clear.  Cardiovascular: The cardiovascular examination reveals a regular rate and rhythm, no obvious murmurs or rubs are noted.  Skin: Extremities are without significant edema on the left, 1+ edema below the knee on the right.  Neurologic Exam  Mental status: The patient is alert and oriented x 3 at the time of the examination. The patient has apparent normal recent and remote memory, with an apparently normal attention span and concentration  ability.  Cranial nerves: Facial symmetry is present. There is good sensation of the face to pinprick and soft touch bilaterally. The strength of the facial muscles and the muscles to head turning and shoulder shrug are normal bilaterally. Speech is well enunciated, no aphasia or dysarthria is noted. Extraocular movements are full. Visual fields are full. The tongue is midline, and the patient has symmetric elevation of the soft palate. No obvious hearing deficits are noted.  Motor: The motor testing reveals 5 over 5 strength of all 4 extremities. Good symmetric motor  tone is noted throughout.  Sensory: Sensory testing is intact to pinprick, soft touch, vibration sensation, and position sense on all 4 extremities, with the exception that there is a stocking pattern pinprick sensory deficit up to the knees bilaterally, a moderate level of vibratory sensation impairment in the feet bilaterally. No evidence of extinction is noted.  Coordination: Cerebellar testing reveals good finger-nose-finger and heel-to-shin bilaterally.  Gait and station: Gait is normal. Tandem gait is unsteady. Romberg is negative. No drift is seen.  Reflexes: Deep tendon reflexes are symmetric and normal bilaterally, with the exception that the ankle jerk reflexes are absent bilaterally. Toes are downgoing bilaterally.   MRI lumbosacral spine 01/12/2013:  IMPRESSION:  1. Normal alignment and minimal degenerative changes for age.  2. Moderate hypertrophic facet disease.  3. Moderate multifactorial spinal and lateral recess stenosis at  L4-5.    Assessment/Plan:  One. Lumbosacral spinal stenosis  2. Possible peripheral neuropathy  The patient has a moderate level of lumbosacral spinal stenosis. Severe spinal stenosis could result in symptoms that are similar to what is seen in a peripheral neuropathy. The patient however, likely does have a peripheral neuropathy independent of the back issues. The patient will be set up for nerve conduction studies of both the legs, and one arm. EMG evaluation will be done on at least one leg. If this study confirms the presence of a peripheral neuropathy, further blood work will be done to look for etiologies of peripheral neuropathy. The patient will followup for the EMG evaluation.   Addendum: The EMG and NCV study was done today. This does confirm a peripheral neuropathy. Blood work was ordered. The patient will follow up in 3 to 4 months.  Jill Alexanders MD 06/22/2013 7:51 PM  [facility] [address] [place], [place] [address]  Phone [phone] Fax [phone]

## 2013-06-22 NOTE — Patient Instructions (Signed)
Spinal Stenosis  Spinal stenosis is an abnormal narrowing of the canals of your spine (vertebrae).  CAUSES   Spinal stenosis is caused by areas of bone pushing into the central canals of your vertebrae. This condition can be present at birth (congenital). It also may be caused by arthritic deterioration of your vertebrae (spinal degeneration).   SYMPTOMS   · Pain that is generally worse with activities, particularly standing and walking.  · Numbness, tingling, hot or cold sensations, weakness, or weariness in your legs.  · Frequent episodes of falling.  · A foot-slapping gait that leads to muscle weakness.  DIAGNOSIS   Spinal stenosis is diagnosed with the use of magnetic resonance imaging (MRI) or computed tomography (CT).  TREATMENT   Initial therapy for spinal stenosis focuses on the management of the pain and other symptoms associated with the condition. These therapies include:  · Practicing postural changes to lessen pressure on your nerves.  · Exercises to strengthen the core of your body.  · Loss of excess body weight.  · The use of nonsteroidal anti-inflammatory medications to reduce swelling and inflammation in your nerves.  When therapies to manage pain are not successful, surgery to treat spinal stenosis may be recommended. This surgery involves removing excess bone, which puts pressure on your nerve roots. During this surgery (laminectomy), the posterior boney arch (lamina) and excess bone around the facet joints are removed.  Document Released: 08/16/2003 Document Revised: 09/20/2012 Document Reviewed: 09/03/2012  [employer]® Patient Information ©2014 [employer].

## 2013-06-24 ENCOUNTER — Telehealth: Payer: Self-pay | Admitting: Neurology

## 2013-06-24 NOTE — Telephone Encounter (Signed)
I called patient. The blood work shows evidence of a monoclonal gammopathy, which is particularly common in older men. The patient also has a slightly low vitamin B12 level. I'll have him come in for a B12 injection, and then we will get him on 1000 mcg tablets of vitamin B12. I will send the blood work report to the primary care doctor.

## 2013-06-24 NOTE — Telephone Encounter (Signed)
Spoke to patient's wife and made B12 injection appointment.  But she continued to ask many questions and wanted to know if the doctor would give him something for the numbness in his lower legs.

## 2013-06-24 NOTE — Telephone Encounter (Signed)
Patient returning call from Dr. Jannifer Franklin regarding making appointment for B12. Please call to advise.

## 2013-06-24 NOTE — Telephone Encounter (Signed)
I called the patient. The patient indicates that he mainly has numbness below the knees. There are no medications that will help numbness. The patient does have a mildly low vitamin B12 level, I doubt this is the cause of his neuropathy. I suspect that the monoclonal gammopathy is the etiology. We will see him back in 3-4 months.

## 2013-06-27 NOTE — Telephone Encounter (Signed)
Patient coming on 06-28-12 for B12 injection.

## 2013-06-28 ENCOUNTER — Ambulatory Visit (INDEPENDENT_AMBULATORY_CARE_PROVIDER_SITE_OTHER): Payer: Medicare Other

## 2013-06-28 DIAGNOSIS — E538 Deficiency of other specified B group vitamins: Secondary | ICD-10-CM

## 2013-06-28 LAB — IFE AND PE, SERUM
ALBUMIN SERPL ELPH-MCNC: 3.8 g/dL (ref 3.2–5.6)
ALBUMIN/GLOB SERPL: 1.2 (ref 0.7–2.0)
ALPHA2 GLOB SERPL ELPH-MCNC: 0.8 g/dL (ref 0.4–1.2)
Alpha 1: 0.2 g/dL (ref 0.1–0.4)
B-GLOBULIN SERPL ELPH-MCNC: 0.9 g/dL (ref 0.6–1.3)
GLOBULIN, TOTAL: 3.2 g/dL (ref 2.0–4.5)
Gamma Glob SerPl Elph-Mcnc: 1.3 g/dL (ref 0.5–1.6)
IGG (IMMUNOGLOBIN G), SERUM: 1638 mg/dL — AB (ref 700–1600)
IgA/Immunoglobulin A, Serum: 46 mg/dL — ABNORMAL LOW (ref 91–414)
M Protein SerPl Elph-Mcnc: 1 g/dL — ABNORMAL HIGH
TOTAL PROTEIN: 7 g/dL (ref 6.0–8.5)

## 2013-06-28 LAB — METHYLMALONIC ACID, SERUM: METHYLMALONIC ACID: 212 nmol/L (ref 0–378)

## 2013-06-28 LAB — RHEUMATOID FACTOR: Rhuematoid fact SerPl-aCnc: 9.8 IU/mL (ref 0.0–13.9)

## 2013-06-28 LAB — ANGIOTENSIN CONVERTING ENZYME: ANGIO CONVERT ENZYME: 36 U/L (ref 14–82)

## 2013-06-28 LAB — TSH: TSH: 3.22 u[IU]/mL (ref 0.450–4.500)

## 2013-06-28 LAB — ANA W/REFLEX: ANA: NEGATIVE

## 2013-06-28 LAB — VITAMIN B12: VITAMIN B 12: 201 pg/mL — AB (ref 211–946)

## 2013-06-28 MED ORDER — CYANOCOBALAMIN 1000 MCG/ML IJ SOLN
1000.0000 ug | Freq: Once | INTRAMUSCULAR | Status: AC
  Administered 2013-06-28: 1000 ug via INTRAMUSCULAR

## 2013-06-28 NOTE — Patient Instructions (Signed)
Counseled patient and wife that patient should continue to take Vit B 12 by mouth daily per MD instruction and follow up and scheduled. Wife has gotten oral Vit B 12 and is patient is taking at home now.

## 2013-06-28 NOTE — Progress Notes (Signed)
Vitamin B 12 1000 mcg/ml injected in L deltoid under aseptic technique. Tolerated well.

## 2013-07-07 ENCOUNTER — Ambulatory Visit (HOSPITAL_COMMUNITY)
Admission: RE | Admit: 2013-07-07 | Discharge: 2013-07-07 | Disposition: A | Discharge status: Home / Self Care | Payer: Medicare Other | Source: Ambulatory Visit | Attending: Pulmonary Disease | Admitting: Pulmonary Disease

## 2013-07-07 ENCOUNTER — Other Ambulatory Visit (HOSPITAL_COMMUNITY): Payer: Self-pay | Admitting: Pulmonary Disease

## 2013-07-07 DIAGNOSIS — M79604 Pain in right leg: Secondary | ICD-10-CM

## 2013-07-07 DIAGNOSIS — M25579 Pain in unspecified ankle and joints of unspecified foot: Secondary | ICD-10-CM | POA: Insufficient documentation

## 2013-07-07 DIAGNOSIS — M7989 Other specified soft tissue disorders: Secondary | ICD-10-CM

## 2013-07-07 DIAGNOSIS — I824Y9 Acute embolism and thrombosis of unspecified deep veins of unspecified proximal lower extremity: Secondary | ICD-10-CM | POA: Insufficient documentation

## 2013-07-08 ENCOUNTER — Ambulatory Visit (HOSPITAL_COMMUNITY): Payer: Medicare Other

## 2013-07-19 ENCOUNTER — Ambulatory Visit (HOSPITAL_COMMUNITY)
Admission: RE | Admit: 2013-07-19 | Discharge: 2013-07-19 | Disposition: A | Discharge status: Home / Self Care | Payer: Medicare Other | Source: Ambulatory Visit | Attending: Pulmonary Disease | Admitting: Pulmonary Disease

## 2013-07-19 ENCOUNTER — Other Ambulatory Visit (HOSPITAL_COMMUNITY): Payer: Self-pay | Admitting: Pulmonary Disease

## 2013-07-19 DIAGNOSIS — R112 Nausea with vomiting, unspecified: Secondary | ICD-10-CM | POA: Insufficient documentation

## 2013-07-19 DIAGNOSIS — R0602 Shortness of breath: Secondary | ICD-10-CM

## 2013-08-24 ENCOUNTER — Other Ambulatory Visit (HOSPITAL_COMMUNITY): Payer: Self-pay | Admitting: Pulmonary Disease

## 2013-08-24 DIAGNOSIS — Z85528 Personal history of other malignant neoplasm of kidney: Secondary | ICD-10-CM

## 2013-08-24 DIAGNOSIS — R1012 Left upper quadrant pain: Secondary | ICD-10-CM

## 2013-08-26 ENCOUNTER — Ambulatory Visit (HOSPITAL_COMMUNITY)
Admission: RE | Admit: 2013-08-26 | Discharge: 2013-08-26 | Disposition: A | Discharge status: Home / Self Care | Payer: Medicare Other | Source: Ambulatory Visit | Attending: Pulmonary Disease | Admitting: Pulmonary Disease

## 2013-08-26 DIAGNOSIS — N289 Disorder of kidney and ureter, unspecified: Secondary | ICD-10-CM | POA: Insufficient documentation

## 2013-08-26 DIAGNOSIS — Z85528 Personal history of other malignant neoplasm of kidney: Secondary | ICD-10-CM

## 2013-08-26 DIAGNOSIS — R9389 Abnormal findings on diagnostic imaging of other specified body structures: Secondary | ICD-10-CM | POA: Insufficient documentation

## 2013-08-26 DIAGNOSIS — R1012 Left upper quadrant pain: Secondary | ICD-10-CM | POA: Insufficient documentation

## 2013-09-15 ENCOUNTER — Telehealth: Payer: Self-pay | Admitting: Neurology

## 2013-09-15 NOTE — Telephone Encounter (Signed)
Pt's wife, Pamala Hurry, called.  She asked if the lab results from 06-22-13 could be faxed to Dr. Luan Pulling, the patient's PCP.  They would also like someone to call them to discuss those results again, because they were told that the patient would need to come back in 6 mos for a B12 check. They weren't sure if there was anything else that would be checked at that time.  She also asked if and when they may need to make another appointment to see Dr. Jannifer Franklin again. They weren't told to make an appointment at the end of their visit back in January.  Please call to discuss. Thank you.

## 2013-10-27 ENCOUNTER — Encounter: Payer: Self-pay | Admitting: Adult Health

## 2013-10-27 ENCOUNTER — Ambulatory Visit (INDEPENDENT_AMBULATORY_CARE_PROVIDER_SITE_OTHER): Payer: Medicare Other | Admitting: Adult Health

## 2013-10-27 ENCOUNTER — Ambulatory Visit: Payer: Self-pay | Admitting: Neurology

## 2013-10-27 VITALS — BP 121/71 | HR 80 | Ht 68.0 in | Wt 205.0 lb

## 2013-10-27 DIAGNOSIS — E538 Deficiency of other specified B group vitamins: Secondary | ICD-10-CM

## 2013-10-27 DIAGNOSIS — M4807 Spinal stenosis, lumbosacral region: Secondary | ICD-10-CM

## 2013-10-27 DIAGNOSIS — M48061 Spinal stenosis, lumbar region without neurogenic claudication: Secondary | ICD-10-CM

## 2013-10-27 DIAGNOSIS — G63 Polyneuropathy in diseases classified elsewhere: Secondary | ICD-10-CM

## 2013-10-27 NOTE — Progress Notes (Signed)
PATIENT: Noah Bailey DOB: Feb 27, 1929  REASON FOR VISIT: follow up HISTORY FROM: patient  HISTORY OF PRESENT ILLNESS: Noah Bailey is an 80 year old male with a history of Lumbosacral spinal stenosis and peripheral neuropathy. He returns today for a follow-up. At his previous visit his B12 was decreased so he was started on injections and then converted to oral medication. He states that he continues to have numbness below the knee but denies discomfort. He states that his biggest problem is knowing where he is placing his feet. He has to be careful so that he does not fall. Denies having any falls. Patient states that the skin on his legs feel "tight." He states after he has been up walking for several hours he has some discomfort in his legs that gets better with rest. He does not use an assistive device when walking.  Patient was diagnosed in January with a blood clot in the right profunda femoris vein and was placed on Eliquis. He returns today for an evaluation.    REVIEW OF SYSTEMS: Full 14 system review of systems performed and notable only for:  Constitutional: N/A  Eyes: N/A Ear/Nose/Throat: N/A  Skin: N/A  Cardiovascular: N/A  Respiratory: N/A  Gastrointestinal: N/A  Genitourinary: N/A Hematology/Lymphatic: N/A  Endocrine: N/A Musculoskeletal:N/A  Allergy/Immunology: N/A  Neurological: memory loss, numbness, weakness Psychiatric: N/A Sleep: N/A   ALLERGIES: Allergies  Allergen Reactions  . Ivp Dye [Iodinated Diagnostic Agents]     Unknown    HOME MEDICATIONS: Outpatient Prescriptions Prior to Visit  Medication Sig Dispense Refill  . acetaminophen (TYLENOL) 500 MG tablet Take 500 mg by mouth every 6 (six) hours as needed. For pain       . Memantine HCl ER (NAMENDA XR) 28 MG CP24 Take 1 capsule by mouth daily.      . Multiple Vitamins-Minerals (OCUVITE ADULT 50+ PO) Take 1 tablet by mouth daily.        . Tamsulosin HCl (FLOMAX) 0.4 MG CAPS Take 0.4 mg by mouth  daily.        . vitamin B-12 (CYANOCOBALAMIN) 1000 MCG tablet Take 1,000 mcg by mouth daily.       No facility-administered medications prior to visit.    PAST MEDICAL HISTORY: Past Medical History  Diagnosis Date  . Arthritis   . Hearing deficit     right hearing aid  . Lumbosacral spinal stenosis 06/22/2013  . BPH (benign prostatic hyperplasia)   . Renal calculi   . Memory disturbance     PAST SURGICAL HISTORY: Past Surgical History  Procedure Laterality Date  . Polypectomy    . Back surgery      Cervical spine  . Lithotripsy    . Cataract extraction Bilateral     FAMILY HISTORY: History reviewed. No pertinent family history.  SOCIAL HISTORY: History   Social History  . Marital Status: Married    Spouse Name: Pamala Hurry    Number of Children: 2  . Years of Education: college   Occupational History  . retired    Social History Main Topics  . Smoking status: Never Smoker   . Smokeless tobacco: Never Used  . Alcohol Use: No  . Drug Use: No  . Sexual Activity: Not on file   Other Topics Concern  . Not on file   Social History Narrative   Patient is married Pamala Hurry) and lives with his wife.   Patient has two children living.   Patient is retired.  Patient has a high school education and vocational.   Patient is right-handed.   Patient drinks a soda sometimes.    PHYSICAL EXAM  Filed Vitals:   10/27/13 1038  BP: 121/71  Pulse: 80  Height: 5\' 8"  (1.727 m)  Weight: 205 lb (92.987 kg)   Body mass index is 31.18 kg/(m^2).  Generalized: Well developed, in no acute distress  Skin: 1+ edema in bilateral lower extremities. Skin is pink, taut and dry in lower extremities below the knee.  Neurological examination  Mentation: Alert oriented to time, place, history taking. Follows all commands speech and language fluent Cranial nerve II-XII: Extraocular movements were full, visual field were full on confrontational test.  Motor: The motor testing reveals 5  over 5 strength of all 4 extremities. Good symmetric motor tone is noted throughout.  Sensory: Sensory testing is intact to soft touch on all 4 extremities. No evidence of extinction is noted.  Coordination: Cerebellar testing reveals good finger-nose-finger and heel-to-shin bilaterally.  Gait and station: Gait is normal but slightly slow. Tandem gait is unsteady. Romberg is negative. No drift is seen.  Reflexes: Deep tendon reflexes are symmetric and normal in the upper extremities. The patellar reflex is symmetric but depressed. The achilles tendon reflex is absent.   DIAGNOSTIC DATA (LABS, IMAGING, TESTING) - I reviewed patient records, labs, notes, testing and imaging myself where available.  Lab Results  Component Value Date   WBC 4.3 01/26/2011   HGB 12.9* 01/26/2011   HCT 37.9* 01/26/2011   MCV 92.2 01/26/2011   PLT 189 01/26/2011      Component Value Date/Time   NA 138 01/26/2011 0947   K 3.6 01/26/2011 0947   CL 104 01/26/2011 0947   CO2 27 01/26/2011 0947   GLUCOSE 135* 01/26/2011 0947   BUN 13 01/26/2011 0947   CREATININE 0.80 01/26/2011 0947   CALCIUM 9.0 01/26/2011 0947   PROT 7.0 06/22/2013 1319   GFRNONAA >60 01/26/2011 0947   GFRAA >60 01/26/2011 0947   No results found for this basename: CHOL, HDL, LDLCALC, LDLDIRECT, TRIG, CHOLHDL   No results found for this basename: HGBA1C   Lab Results  Component Value Date   VITAMINB12 201* 06/22/2013   Lab Results  Component Value Date   TSH 3.220 06/22/2013      ASSESSMENT AND PLAN 80 y.o. year old male  has a past medical history of Arthritis; Hearing deficit; Lumbosacral spinal stenosis (06/22/2013); BPH (benign prostatic hyperplasia); Renal calculi; and Memory disturbance. here with:  1. Peripheral neuropathy 2. Lumbosacral spinal stenosis 3. Vitamin B12 deficiency  Patient continues to have numbness below the knee into the foot but without discomfort. Patient complains that after several hours of being up on his feet he  gets some discomfort in his legs but it is resolved with rest. Patient denies burning or tingling in the feet. His discomfort is most likely due to spinal stenosis. Previous nerve conduction studies confirmed a peripheral neuropathy. I explained to the patient and his wife in great detail that the etiology is most likely monoclonal gammopathy. I explained that monoclonal gammopathy is common in older men and we would monitor this over time. Blood work in 2012 showed a mild anemia, at his next visit we will check a CBC, if anemia has worsened we will refer the patient to a hematologist.Explained to the patient that there is no treatment to reverse the numbness in his lower legs. In the future, if he starts having neuropathic pain we can  consider medication to relieve the pain at that time. At his last visit patient had a low B12 level and has since been taking oral B12 supplements. Today we will recheck his B12 level. Patient should follow-up in 6 months or sooner if needed.    Ward Givens, MSM, NP-C 10/27/2013, 10:54 AM [facility] [address], [place], [place] [address] [phone]  Note: This document was prepared with digital dictation and possible smart phrase technology. Any transcriptional errors that result from this process are unintentional.

## 2013-10-27 NOTE — Patient Instructions (Signed)
Vitamin B12 Deficiency Not having enough vitamin B12 is called a deficiency. Your body needs this vitamin for important body functions. HOME CARE  Take all vitamins, herbs, or nutrition drinks (supplements) as told by your doctor.  Get shots (injections) as told. Do not miss your doctor visit.  Eat foods than contain vitamin B12. This includes:  Meat.  Chicken, [place], or other birds (poultry).  Fish.  Eggs.  Cereals or milk with added vitamin B12. Check the label.  Do not drink too much (abuse) alcohol.  Keep all doctor visits as told. GET HELP IF:  You have questions.  Your problems come back. MAKE SURE YOU:  Understand these instructions.  Will watch your condition.  Will get help right away if you are not doing well or get worse. Document Released: 05/15/2011 Document Revised: 08/18/2011 Document Reviewed: 05/15/2011 [facility] Patient Information 2014 [place], [place].

## 2013-10-27 NOTE — Progress Notes (Signed)
Please call the patient. The blood work results are unremarkable. Thank you.  

## 2013-10-28 ENCOUNTER — Telehealth: Payer: Self-pay | Admitting: Adult Health

## 2013-10-28 LAB — VITAMIN B12: Vitamin B-12: 897 pg/mL (ref 211–946)

## 2013-10-28 NOTE — Telephone Encounter (Signed)
I called the patient and spoke to his wife. I let her know the B12 level was now in normal range. He should continue taking the oral B12 supplements daily. She verbalized understanding.

## 2013-11-24 ENCOUNTER — Other Ambulatory Visit (HOSPITAL_COMMUNITY): Payer: Self-pay | Admitting: Pulmonary Disease

## 2013-11-24 DIAGNOSIS — I82409 Acute embolism and thrombosis of unspecified deep veins of unspecified lower extremity: Secondary | ICD-10-CM

## 2013-12-15 ENCOUNTER — Ambulatory Visit (HOSPITAL_COMMUNITY)
Admission: RE | Admit: 2013-12-15 | Discharge: 2013-12-15 | Disposition: A | Discharge status: Home / Self Care | Payer: Medicare Other | Source: Ambulatory Visit | Attending: Pulmonary Disease | Admitting: Pulmonary Disease

## 2013-12-15 DIAGNOSIS — R609 Edema, unspecified: Secondary | ICD-10-CM | POA: Insufficient documentation

## 2013-12-15 DIAGNOSIS — I82409 Acute embolism and thrombosis of unspecified deep veins of unspecified lower extremity: Secondary | ICD-10-CM

## 2013-12-15 DIAGNOSIS — Z86718 Personal history of other venous thrombosis and embolism: Secondary | ICD-10-CM | POA: Insufficient documentation

## 2014-05-08 ENCOUNTER — Ambulatory Visit: Payer: Medicare Other | Admitting: Adult Health

## 2014-06-14 ENCOUNTER — Other Ambulatory Visit (HOSPITAL_COMMUNITY): Payer: Self-pay | Admitting: Pulmonary Disease

## 2014-06-14 DIAGNOSIS — R1900 Intra-abdominal and pelvic swelling, mass and lump, unspecified site: Secondary | ICD-10-CM

## 2014-06-15 ENCOUNTER — Other Ambulatory Visit (HOSPITAL_COMMUNITY): Payer: Self-pay | Admitting: Pulmonary Disease

## 2014-06-15 DIAGNOSIS — R1900 Intra-abdominal and pelvic swelling, mass and lump, unspecified site: Secondary | ICD-10-CM

## 2014-06-15 DIAGNOSIS — R0781 Pleurodynia: Secondary | ICD-10-CM

## 2014-06-16 ENCOUNTER — Ambulatory Visit (HOSPITAL_COMMUNITY)
Admission: RE | Admit: 2014-06-16 | Discharge: 2014-06-16 | Disposition: A | Discharge status: Home / Self Care | Payer: Medicare Other | Source: Ambulatory Visit | Attending: Pulmonary Disease | Admitting: Pulmonary Disease

## 2014-06-16 ENCOUNTER — Other Ambulatory Visit (HOSPITAL_COMMUNITY): Payer: Self-pay | Admitting: Pulmonary Disease

## 2014-06-16 DIAGNOSIS — R1900 Intra-abdominal and pelvic swelling, mass and lump, unspecified site: Secondary | ICD-10-CM

## 2014-06-16 DIAGNOSIS — R6889 Other general symptoms and signs: Secondary | ICD-10-CM | POA: Diagnosis not present

## 2014-06-16 DIAGNOSIS — R05 Cough: Secondary | ICD-10-CM | POA: Insufficient documentation

## 2014-06-16 DIAGNOSIS — R0781 Pleurodynia: Secondary | ICD-10-CM

## 2014-06-16 DIAGNOSIS — N200 Calculus of kidney: Secondary | ICD-10-CM | POA: Insufficient documentation

## 2014-06-16 DIAGNOSIS — K7689 Other specified diseases of liver: Secondary | ICD-10-CM | POA: Insufficient documentation

## 2014-06-16 DIAGNOSIS — R140 Abdominal distension (gaseous): Secondary | ICD-10-CM | POA: Diagnosis present

## 2014-12-04 ENCOUNTER — Other Ambulatory Visit: Payer: Self-pay

## 2015-04-03 ENCOUNTER — Other Ambulatory Visit (HOSPITAL_COMMUNITY): Payer: Self-pay | Admitting: Pulmonary Disease

## 2015-04-03 DIAGNOSIS — I739 Peripheral vascular disease, unspecified: Secondary | ICD-10-CM

## 2015-04-03 DIAGNOSIS — R221 Localized swelling, mass and lump, neck: Secondary | ICD-10-CM

## 2015-04-06 ENCOUNTER — Ambulatory Visit (HOSPITAL_COMMUNITY)
Admission: RE | Admit: 2015-04-06 | Discharge: 2015-04-06 | Disposition: A | Discharge status: Home / Self Care | Payer: Medicare Other | Source: Ambulatory Visit | Attending: Pulmonary Disease | Admitting: Pulmonary Disease

## 2015-04-06 DIAGNOSIS — I739 Peripheral vascular disease, unspecified: Secondary | ICD-10-CM

## 2015-04-06 DIAGNOSIS — R0989 Other specified symptoms and signs involving the circulatory and respiratory systems: Secondary | ICD-10-CM | POA: Diagnosis present

## 2015-04-06 DIAGNOSIS — R221 Localized swelling, mass and lump, neck: Secondary | ICD-10-CM

## 2015-04-06 DIAGNOSIS — E042 Nontoxic multinodular goiter: Secondary | ICD-10-CM | POA: Insufficient documentation

## 2016-02-12 IMAGING — US US PELVIS LIMITED
1 series · 12 of 12 positions shown · non-contrast
Comparison: None.

CLINICAL DATA: Renal calculi.

EXAM:
LIMITED ULTRASOUND OF PELVIS
TECHNIQUE: Limited transabdominal ultrasound examination of the pelvis was
performed.

[Series 1: us pelvis limited · 0.24mm/px · 12 of 12 slices shown]
[im 1/12]
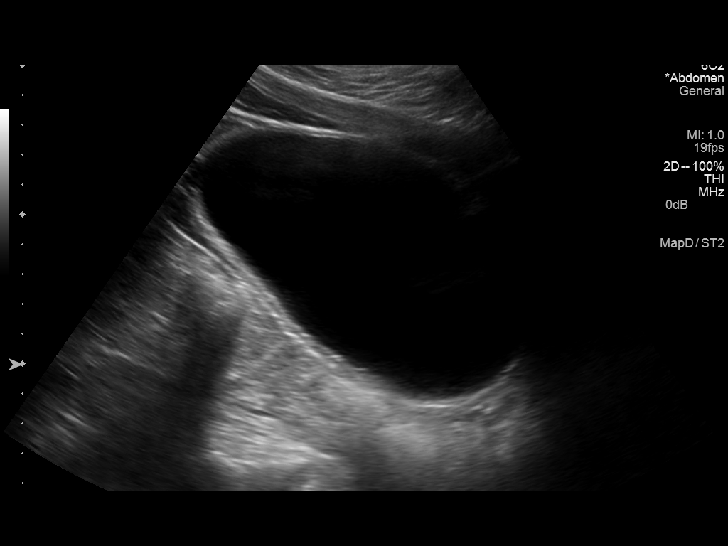
[im 2/12]
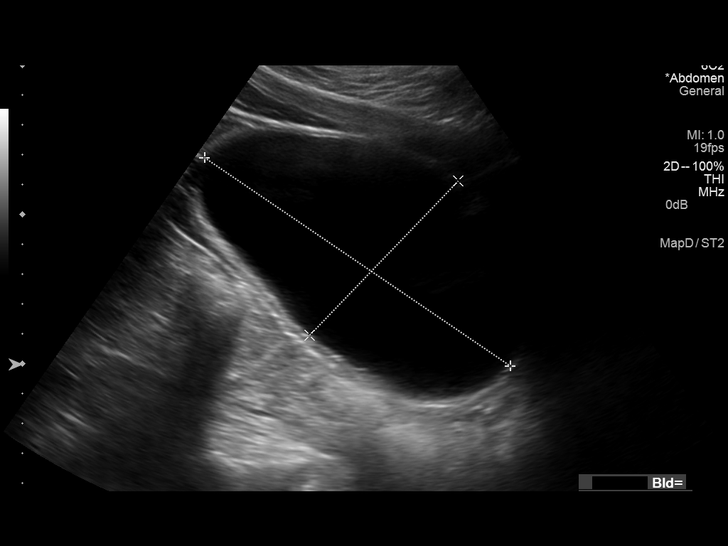
[im 3/12]
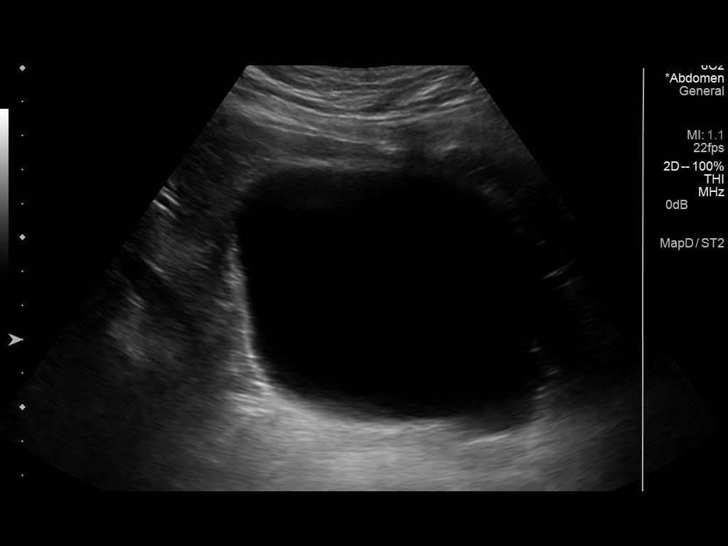
[im 4/12]
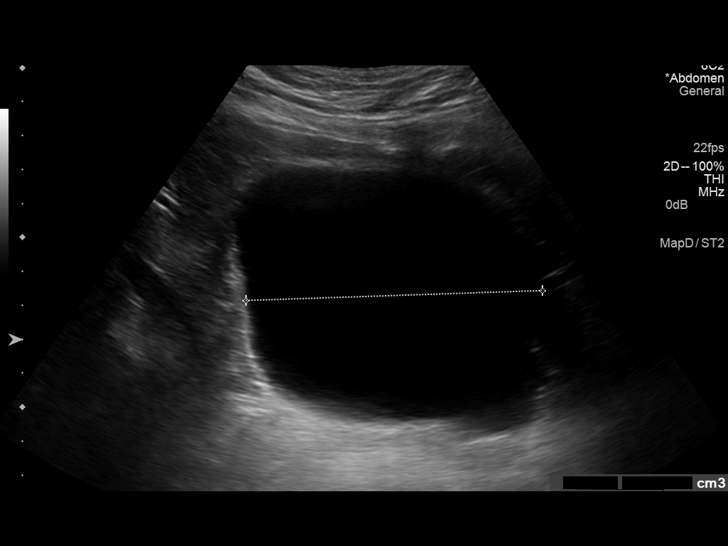
[im 5/12]
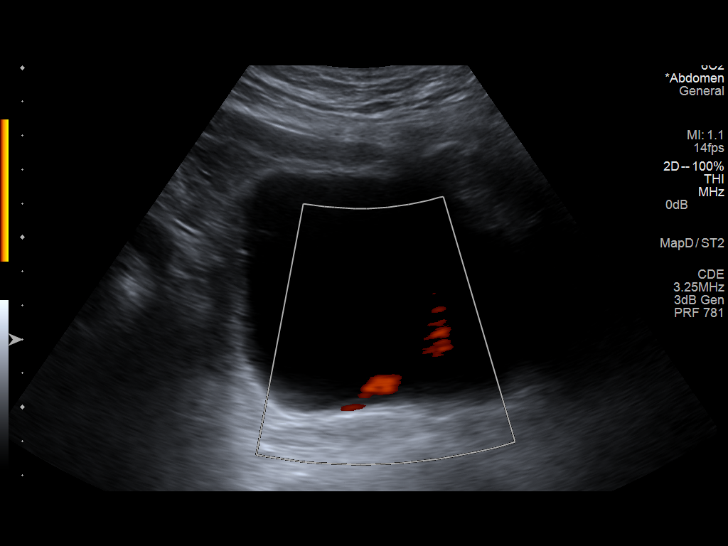
[im 6/12]
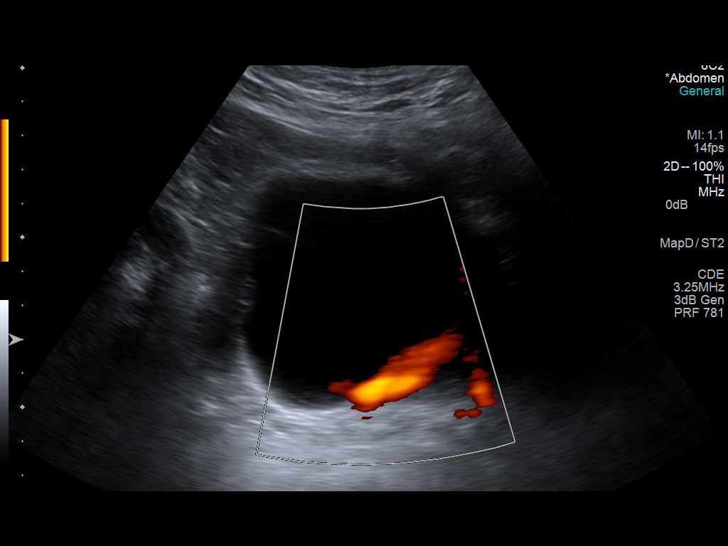
[im 7/12]
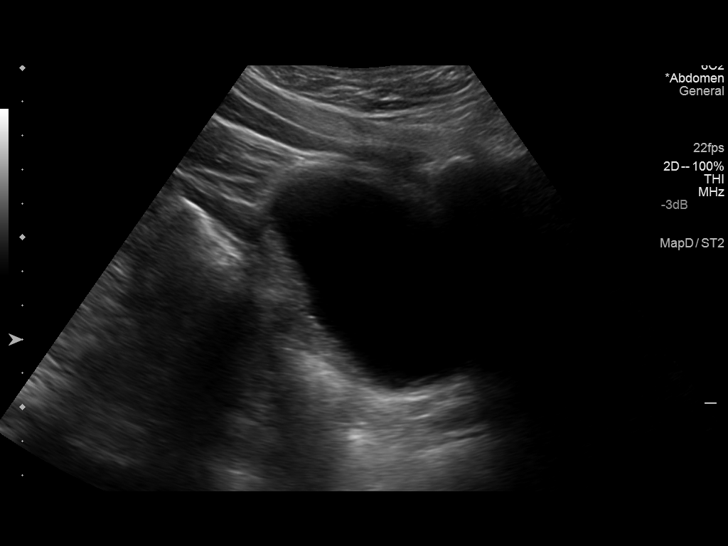
[im 8/12]
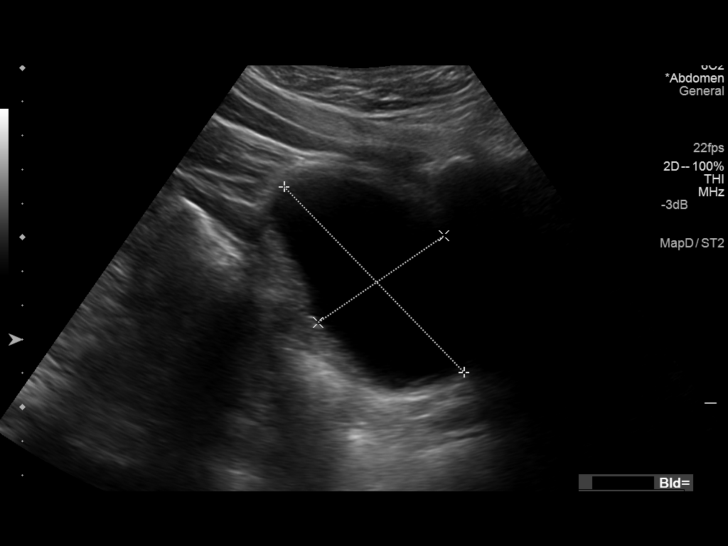
[im 9/12]
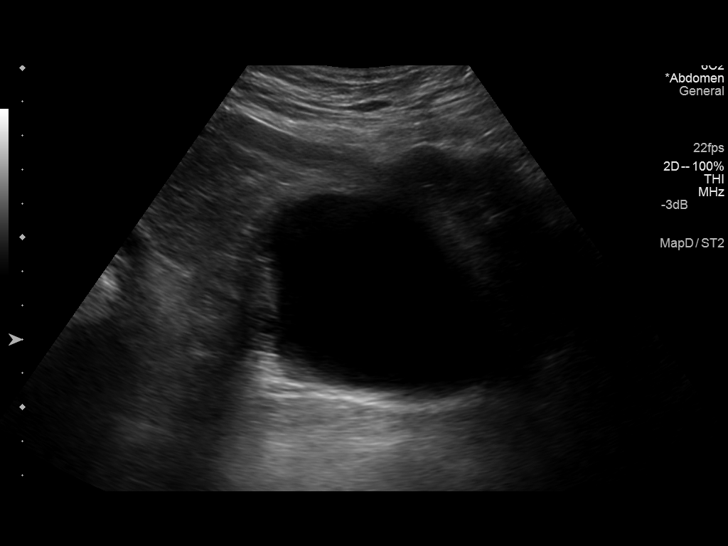
[im 10/12]
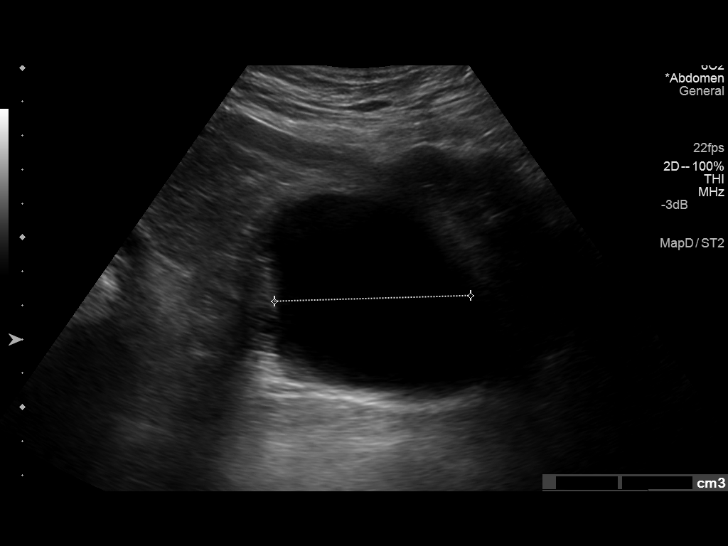
[im 11/12]
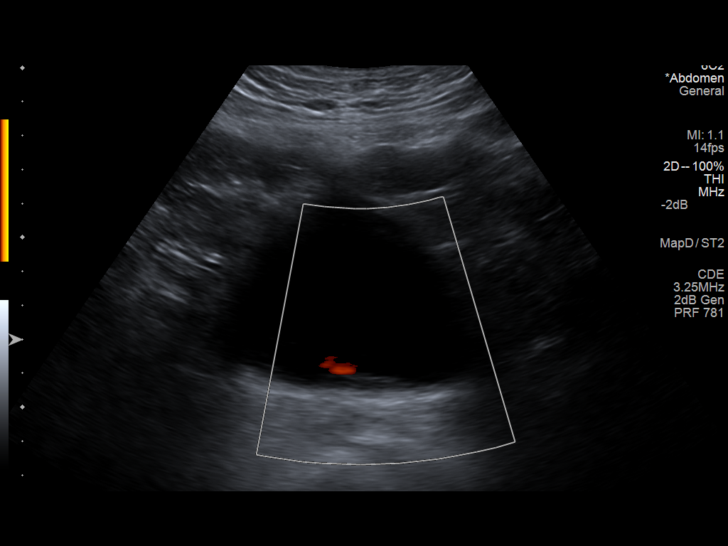
[im 12/12]
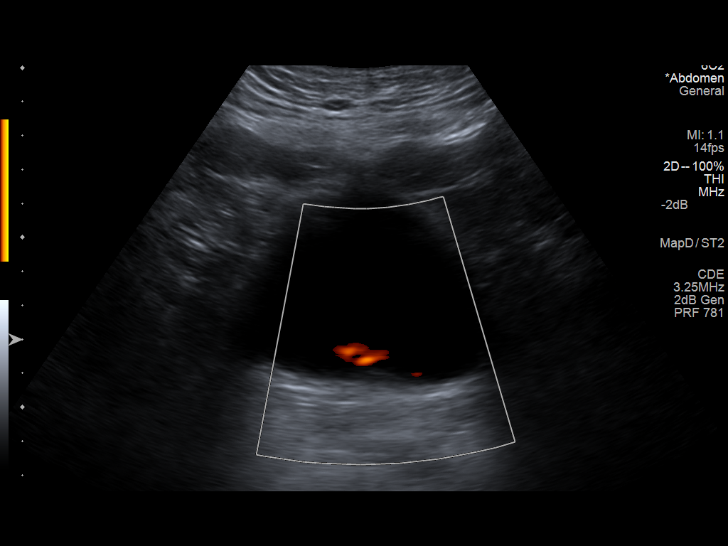

[12 of 12 positions shown; findings below may reference images not displayed]

FINDINGS: Bladder appears unremarkable. Prevoid volume is 405 cc Postvoid
volume is 104 cc.
IMPRESSION: Bladder demonstrates postvoid residual of 104 cc.

## 2016-02-12 IMAGING — CR DG RIBS W/ CHEST 3+V*L*
4 series · 4 of 4 positions shown · non-contrast
Comparison: 07/19/2013

CLINICAL DATA: Cough, congestion for 5-6 days

EXAM:
LEFT RIBS AND CHEST - 3+ VIEW

[view not recorded (1 of 4)]
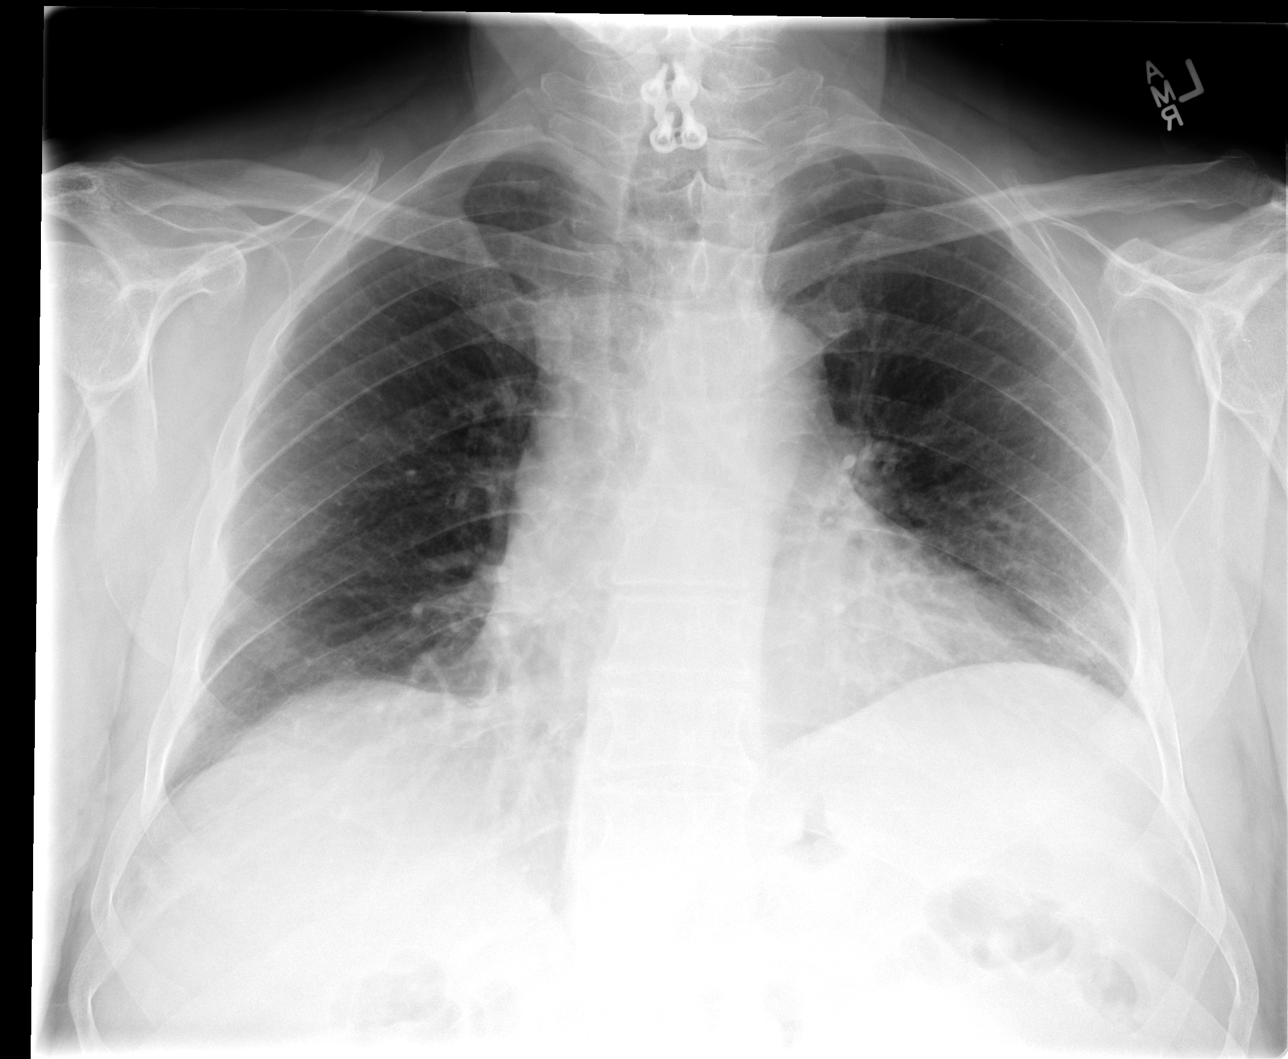

[view not recorded (2 of 4)]
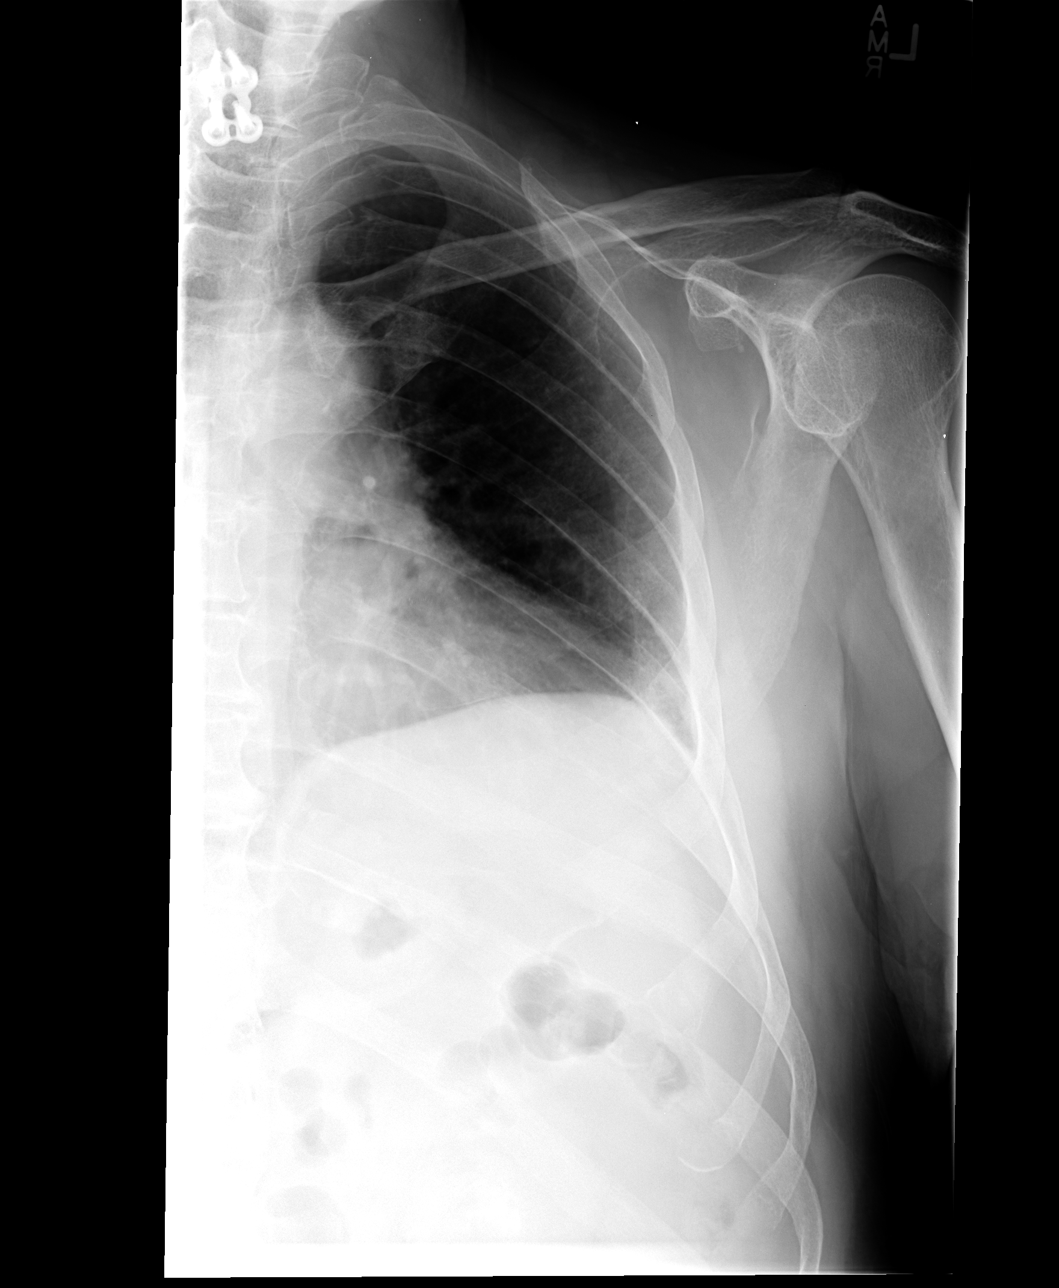

[view not recorded (3 of 4)]
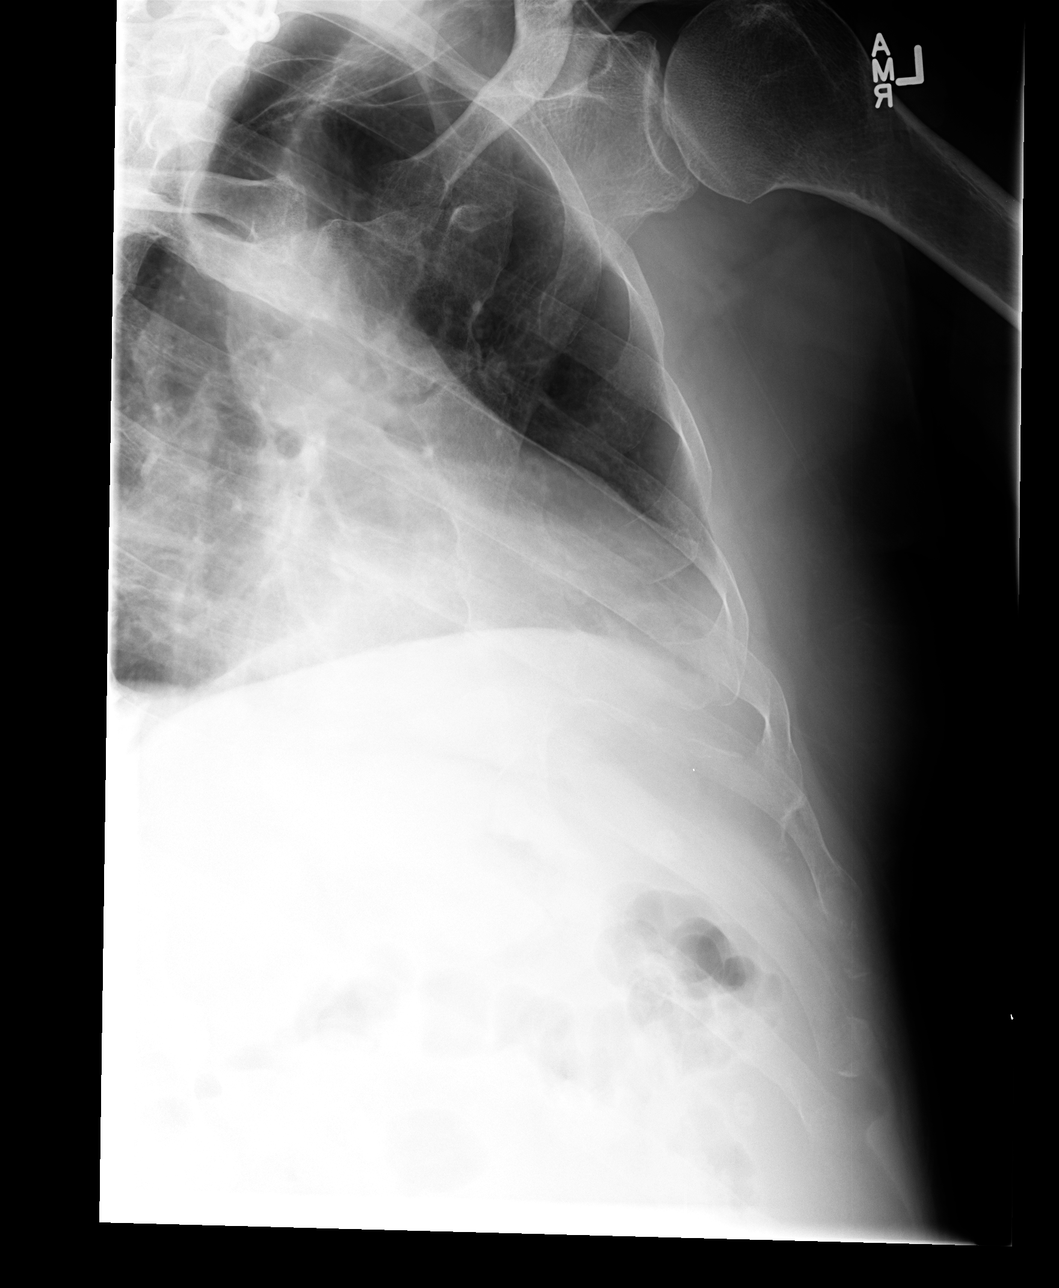

[view not recorded (4 of 4)]
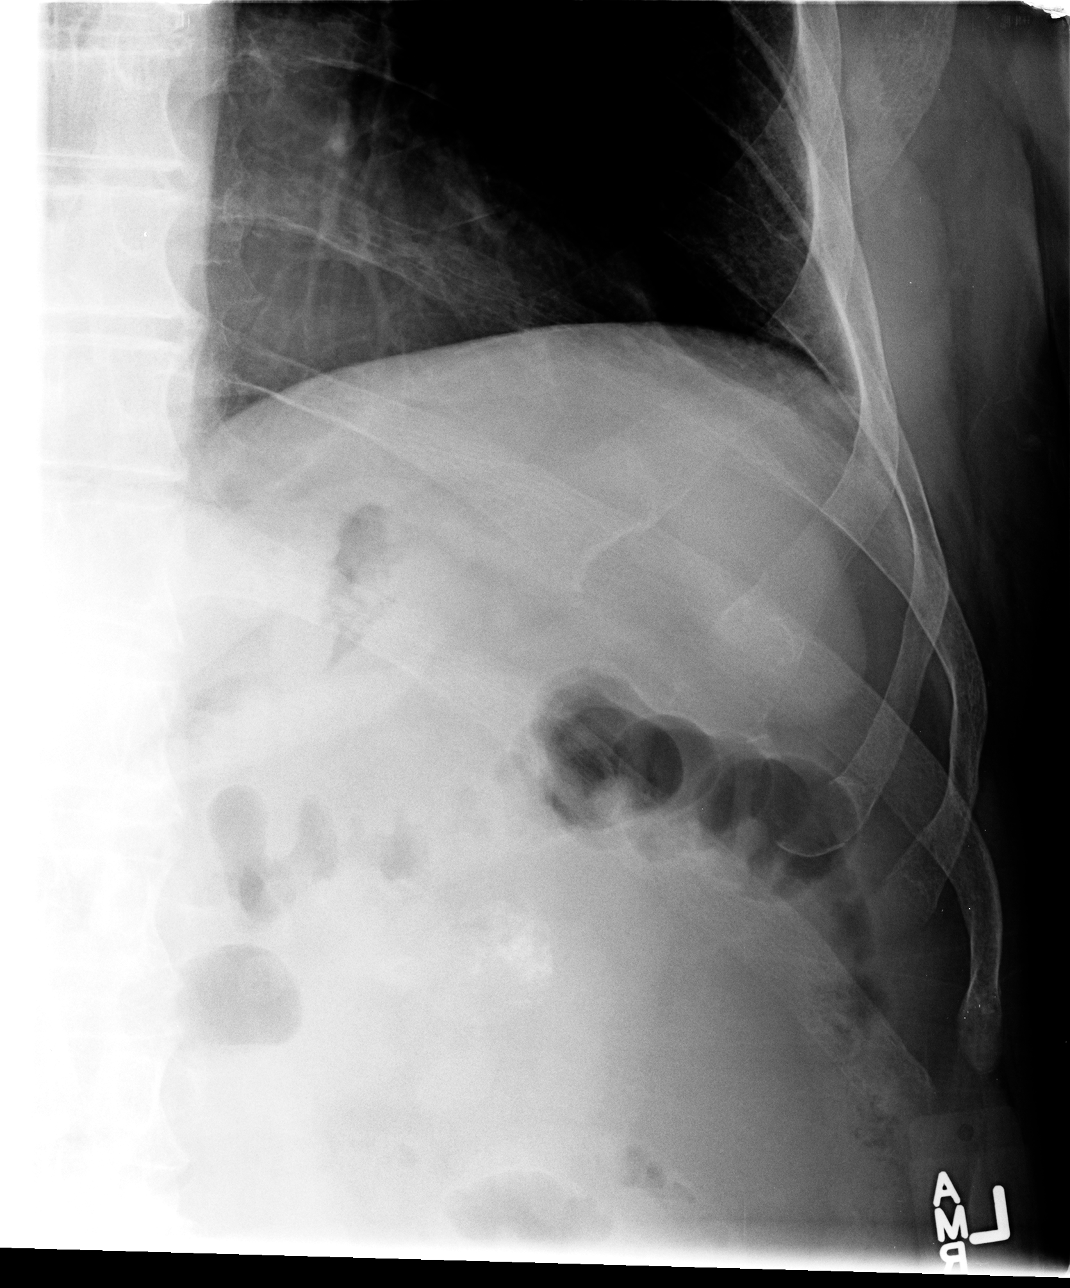

[4 of 4 positions shown; findings below may reference images not displayed]

FINDINGS: No fracture or other bone lesions are seen involving the ribs. There
is no evidence of pneumothorax or pleural effusion. Hazy left lower
lobe airspace disease which may reflect atelectasis versus
developing pneumonia. Heart size and mediastinal contours are within
normal limits.
IMPRESSION: No acute osseous injury of the left ribs.

Hazy left lower lobe airspace disease which may reflect atelectasis
versus developing pneumonia.

## 2016-03-20 ENCOUNTER — Ambulatory Visit (INDEPENDENT_AMBULATORY_CARE_PROVIDER_SITE_OTHER): Payer: Medicare Other | Admitting: Otolaryngology

## 2016-03-20 DIAGNOSIS — H6122 Impacted cerumen, left ear: Secondary | ICD-10-CM

## 2016-03-20 DIAGNOSIS — H903 Sensorineural hearing loss, bilateral: Secondary | ICD-10-CM | POA: Diagnosis not present

## 2016-03-21 ENCOUNTER — Ambulatory Visit (HOSPITAL_COMMUNITY)
Admission: RE | Admit: 2016-03-21 | Discharge: 2016-03-21 | Disposition: A | Discharge status: Home / Self Care | Payer: Medicare Other | Source: Ambulatory Visit | Attending: Pulmonary Disease | Admitting: Pulmonary Disease

## 2016-03-21 ENCOUNTER — Other Ambulatory Visit (HOSPITAL_COMMUNITY): Payer: Self-pay | Admitting: Pulmonary Disease

## 2016-03-21 DIAGNOSIS — M545 Low back pain: Secondary | ICD-10-CM | POA: Insufficient documentation

## 2016-03-21 DIAGNOSIS — R29898 Other symptoms and signs involving the musculoskeletal system: Secondary | ICD-10-CM | POA: Insufficient documentation

## 2016-03-21 DIAGNOSIS — M47816 Spondylosis without myelopathy or radiculopathy, lumbar region: Secondary | ICD-10-CM | POA: Insufficient documentation

## 2016-03-21 DIAGNOSIS — N200 Calculus of kidney: Secondary | ICD-10-CM | POA: Insufficient documentation

## 2016-03-21 DIAGNOSIS — M160 Bilateral primary osteoarthritis of hip: Secondary | ICD-10-CM | POA: Insufficient documentation

## 2016-03-21 DIAGNOSIS — M5134 Other intervertebral disc degeneration, thoracic region: Secondary | ICD-10-CM | POA: Diagnosis not present

## 2016-05-14 ENCOUNTER — Emergency Department (HOSPITAL_COMMUNITY): Payer: Medicare Other

## 2016-05-14 ENCOUNTER — Encounter (HOSPITAL_COMMUNITY): Payer: Self-pay | Admitting: *Deleted

## 2016-05-14 ENCOUNTER — Observation Stay (HOSPITAL_COMMUNITY)
Admission: EM | Admit: 2016-05-14 | Discharge: 2016-05-15 | Disposition: A | Discharge status: Home / Self Care | Payer: Medicare Other | Attending: Pulmonary Disease | Admitting: Pulmonary Disease

## 2016-05-14 DIAGNOSIS — G309 Alzheimer's disease, unspecified: Principal | ICD-10-CM | POA: Insufficient documentation

## 2016-05-14 DIAGNOSIS — R4182 Altered mental status, unspecified: Secondary | ICD-10-CM | POA: Diagnosis present

## 2016-05-14 DIAGNOSIS — R2689 Other abnormalities of gait and mobility: Secondary | ICD-10-CM

## 2016-05-14 DIAGNOSIS — Z7982 Long term (current) use of aspirin: Secondary | ICD-10-CM | POA: Diagnosis not present

## 2016-05-14 DIAGNOSIS — Z79899 Other long term (current) drug therapy: Secondary | ICD-10-CM | POA: Insufficient documentation

## 2016-05-14 DIAGNOSIS — F0280 Dementia in other diseases classified elsewhere without behavioral disturbance: Secondary | ICD-10-CM | POA: Diagnosis present

## 2016-05-14 LAB — CBC WITH DIFFERENTIAL/PLATELET
BASOS ABS: 0 10*3/uL (ref 0.0–0.1)
Basophils Relative: 0 %
EOS ABS: 0.1 10*3/uL (ref 0.0–0.7)
EOS PCT: 1 %
HEMATOCRIT: 36.7 % — AB (ref 39.0–52.0)
Hemoglobin: 12 g/dL — ABNORMAL LOW (ref 13.0–17.0)
LYMPHS PCT: 19 %
Lymphs Abs: 1.6 10*3/uL (ref 0.7–4.0)
MCH: 31.4 pg (ref 26.0–34.0)
MCHC: 32.7 g/dL (ref 30.0–36.0)
MCV: 96.1 fL (ref 78.0–100.0)
MONO ABS: 0.5 10*3/uL (ref 0.1–1.0)
Monocytes Relative: 6 %
Neutro Abs: 6.3 10*3/uL (ref 1.7–7.7)
Neutrophils Relative %: 75 %
Platelets: 205 10*3/uL (ref 150–400)
RBC: 3.82 MIL/uL — ABNORMAL LOW (ref 4.22–5.81)
RDW: 13.9 % (ref 11.5–15.5)
WBC: 8.4 10*3/uL (ref 4.0–10.5)

## 2016-05-14 LAB — COMPREHENSIVE METABOLIC PANEL
ALT: 25 U/L (ref 17–63)
AST: 22 U/L (ref 15–41)
Albumin: 3.4 g/dL — ABNORMAL LOW (ref 3.5–5.0)
Alkaline Phosphatase: 66 U/L (ref 38–126)
Anion gap: 5 (ref 5–15)
BILIRUBIN TOTAL: 0.6 mg/dL (ref 0.3–1.2)
BUN: 17 mg/dL (ref 6–20)
CO2: 29 mmol/L (ref 22–32)
Calcium: 8.5 mg/dL — ABNORMAL LOW (ref 8.9–10.3)
Chloride: 102 mmol/L (ref 101–111)
Creatinine, Ser: 1.09 mg/dL (ref 0.61–1.24)
GFR calc Af Amer: >60 mL/min (ref >60)
GFR, EST NON AFRICAN AMERICAN: 60 mL/min — AB (ref >60)
GLUCOSE: 129 mg/dL — AB (ref 65–99)
POTASSIUM: 3.7 mmol/L (ref 3.5–5.1)
Sodium: 136 mmol/L (ref 135–145)
TOTAL PROTEIN: 7.5 g/dL (ref 6.5–8.1)

## 2016-05-14 LAB — URINALYSIS, ROUTINE W REFLEX MICROSCOPIC
BILIRUBIN URINE: NEGATIVE
Bacteria, UA: NONE SEEN
Glucose, UA: NEGATIVE mg/dL
Ketones, ur: NEGATIVE mg/dL
Leukocytes, UA: NEGATIVE
Nitrite: NEGATIVE
PROTEIN: 30 mg/dL — AB
Specific Gravity, Urine: 1.017 (ref 1.005–1.030)
pH: 7 (ref 5.0–8.0)

## 2016-05-14 NOTE — ED Provider Notes (Signed)
[place] DEPT Provider Note   CSN: [id number] Arrival date & time: 05/14/16  2025  By signing my name below, I, Reola Mosher, attest that this documentation has been prepared under the direction and in the presence of Forde Dandy, MD. Electronically Signed: Reola Mosher, ED Scribe. 05/14/16. 9:12 PM.  History   Chief Complaint Chief Complaint  Patient presents with  . Altered Mental Status   LEVEL 5 CAVEAT: HPI and ROS limited due to AMS  The history is provided by the patient and a relative. History limited by: dementia and altered mental status. No language interpreter was used.    HPI Comments: Noah Bailey is a 82 y.o. male BIB EMS, with a PMHx of dementia, who presents to the Emergency Department with family who are complaining of altered mental status. Last known normal: yesterday. Per wife, pt has a h/o dementia, however, since this morning he has been acting significantly away from his baseline. His wife describes this as him being more unresponsive, unable to follow simple commands, and not answering questions appropriately. She notes that he has had several similar episodes of trending significantly away from his baseline over the past several months, but he has been able to rebound each time on his own and without intervention. However, she notes that during this episode he has been unable to rebound and his episodes of AMS have never lasted this long before. They also note that he has been more lethargic and weaker from his baseline since the onset of this episode. They additionally report that he has had more of a tremor on his left-side recently, especially with intention. Pt was seen by his PCP this morning for this issue, however, with no significant workup noted. Pt was also seen by Dr Ernest Haber (Neurology) at [facility] for a dementia workup 8 months ago. Pt had an MRI/CT head approximately at that time, which was remarkable for several prior CVAs, per  family. Prior chart review reveals that he is scheduled for f/u w/ his Neurologist on 05/23/16 (~9 days away). No recent illnesses/infections. Pt has been ambulating with a walker at baseline over the past week. Denies diarrhea, congestion, cough, fever, or any other associated symptoms.   Past Medical History:  Diagnosis Date  . Arthritis   . BPH (benign prostatic hyperplasia)   . Hearing deficit    right hearing aid  . Lumbosacral spinal stenosis 06/22/2013  . Memory disturbance   . Renal calculi    Patient Active Problem List   Diagnosis Date Noted  . Lumbosacral spinal stenosis 06/22/2013  . Disturbance of skin sensation 06/22/2013  . Acquired trigger finger 09/30/2011  . OA (osteoarthritis) of knee 09/30/2011  . Arthritis of knee 06/19/2011   Past Surgical History:  Procedure Laterality Date  . BACK SURGERY     Cervical spine  . CATARACT EXTRACTION Bilateral   . LITHOTRIPSY    . POLYPECTOMY      Home Medications    Prior to Admission medications   Medication Sig Start Date End Date Taking? Authorizing Provider  acetaminophen (TYLENOL 8 HOUR) 650 MG CR tablet Take 650 mg by mouth at bedtime.   Yes Historical Provider, MD  aspirin EC 81 MG tablet Take 81 mg by mouth daily.   Yes Historical Provider, MD  furosemide (LASIX) 40 MG tablet Take 40 mg by mouth daily as needed for fluid or edema.    Yes Historical Provider, MD  gabapentin (NEURONTIN) 300 MG capsule Take 100  mg by mouth at bedtime.   Yes Historical Provider, MD  memantine (NAMENDA) 10 MG tablet Take 10 mg by mouth 2 (two) times daily.   Yes Historical Provider, MD  rivastigmine (EXELON) 3 MG capsule Take 3 mg by mouth 2 (two) times daily.   Yes Historical Provider, MD  tamsulosin (FLOMAX) 0.4 MG CAPS capsule Take 0.4 mg by mouth at bedtime.   Yes Historical Provider, MD  vitamin B-12 (CYANOCOBALAMIN) 1000 MCG tablet Take 1,000 mcg by mouth at bedtime.    Yes Historical Provider, MD  clopidogrel (PLAVIX) 75 MG  tablet Take 75 mg by mouth daily.    Historical Provider, MD   Family History History reviewed. No pertinent family history.  Social History Social History  Substance Use Topics  . Smoking status: Never Smoker  . Smokeless tobacco: Never Used  . Alcohol use No   Allergies   Aricept [donepezil hcl]; Ivp dye [iodinated diagnostic agents]; and Namzaric [memantine hcl-donepezil hcl]  Review of Systems Review of Systems  Unable to perform ROS: Mental status change   Physical Exam Updated Vital Signs BP 124/92   Pulse 102   Temp 98.6 F (37 C) (Oral)   Resp 18   SpO2 96%   Physical Exam Physical Exam  Nursing note and vitals reviewed. Constitutional: Well developed, well nourished, non-toxic, and in no acute distress Head: Normocephalic and atraumatic.  Mouth/Throat: Oropharynx is clear and moist.  Neck: Normal range of motion. Neck supple.  Cardiovascular: Normal rate and regular rhythm.   Pulmonary/Chest: Effort normal and breath sounds normal.  Abdominal: Soft. There is no tenderness. There is no rebound and no guarding.  Musculoskeletal: Normal range of motion.  Skin: Skin is warm and dry.  Psychiatric: Cooperative  Neurological:  Alert, disoriented. Fluent speech. No dysarthria. Often inappropriately answers question.  Cranial nerves: Pupils are symmetric, and reactive to light. EOMI without nystagmus. No gaze deviation. Facial muscles symmetric with activation. Sensation to light touch over face in tact bilaterally. Hearing grossly in tact. Palate elevates symmetrically. Head turn and shoulder shrug are intact. Tongue midline.  Reflexes defered.  Muscle bulk and tone normal. No pronator drift. Moves all extremities symmetrically. Sensation to light touch is in tact throughout in bilateral upper and lower extremities. Coordination reveals no dysmetria with finger to nose in the RUE, unable to perform in LUE.   ED Treatments / Results  DIAGNOSTIC STUDIES: Oxygen  Saturation is 93% on 2LO2NC, adequate by my interpretation.   COORDINATION OF CARE: 9:09 PM-Discussed next steps with pt and family. Pt and family verbalized understanding and is agreeable with the plan.   Labs (all labs ordered are listed, but only abnormal results are displayed) Labs Reviewed  CBC WITH DIFFERENTIAL/PLATELET - Abnormal; Notable for the following:       Result Value   RBC 3.82 (*)    Hemoglobin 12.0 (*)    HCT 36.7 (*)    All other components within normal limits  COMPREHENSIVE METABOLIC PANEL - Abnormal; Notable for the following:    Glucose, Bld 129 (*)    Calcium 8.5 (*)    Albumin 3.4 (*)    GFR calc non Af Amer 60 (*)    All other components within normal limits  URINALYSIS, ROUTINE W REFLEX MICROSCOPIC - Abnormal; Notable for the following:    APPearance HAZY (*)    Hgb urine dipstick MODERATE (*)    Protein, ur 30 (*)    All other components within normal limits  EKG  EKG Interpretation None      Radiology Dg Chest 2 View  Result Date: 05/14/2016 CLINICAL DATA:  Progressive altered mental status with worsening orientation and confusion. EXAM: CHEST  2 VIEW COMPARISON:  Chest and left rib radiographs 06/16/2014 FINDINGS: The heart is enlarged. This is exaggerate by low lung volumes. Mild bibasilar atelectasis is present. The lungs are otherwise clear. There is no edema or effusion. The visualized soft tissues and bony thorax are unremarkable. IMPRESSION: 1. Low lung volumes. 2. Mild cardiomegaly without failure. 3. No acute cardiopulmonary disease. Electronically Signed   By: San Morelle M.D.   On: 05/14/2016 22:01   Ct Head Wo Contrast  Result Date: 05/14/2016 CLINICAL DATA:  Altered mental status.  History of dementia. EXAM: CT HEAD WITHOUT CONTRAST TECHNIQUE: Contiguous axial images were obtained from the base of the skull through the vertex without intravenous contrast. COMPARISON:  04/03/2011 FINDINGS: BRAIN: There is sulcal and  ventricular prominence consistent with superficial and central atrophy. The ventricles appear slightly more prominent than on prior exam suggesting progression of atrophy. No intraparenchymal hemorrhage, mass effect nor midline shift. There is also an increased amount of periventricular and subcortical white matter hypodensities consistent with progression of chronic small vessel ischemic disease. No acute large vascular territory infarcts. No abnormal extra-axial fluid collections. Basal cisterns are patent. VASCULAR: Moderate calcific atherosclerosis of the carotid siphons. SKULL: No skull fracture. No significant scalp soft tissue swelling. SINUSES/ORBITS: The mastoid air-cells are clear. The moderate ethmoid sinus mucosal thickening. Minimal mucosal thickening posteriorly within both maxillary sinuses. Antrostomy defects along both medial maxillary walls with bilateral middle turbinectomies. Bilateral lens replacements surgeries. OTHER: None. IMPRESSION: Progression of cerebral atrophy and chronic periventricular as well as subcortical white matter small vessel ischemia since 2012 CT. Bilateral sinus surgical changes involving both maxillary sinuses and middle turbinates. Chronic mild ethmoid sinusitis. Electronically Signed   By: Ashley Royalty M.D.   On: 05/14/2016 22:21    Procedures Procedures   Medications Ordered in ED Medications - No data to display  Initial Impression / Assessment and Plan / ED Course  I have reviewed the triage vital signs and the nursing notes.  Pertinent labs & imaging results that were available during my care of the patient were reviewed by me and considered in my medical decision making (see chart for details).  Clinical Course    History of dementia w/ disorientation, inappropriate response, and difficulty following commands. Has had this before per family, but usually gets better. No focal deficits on exam. Does often answer questions inappropriately here, and needs  repeated prompting for complex commands. Seems like more progressive dementia than delirium. No major electrolyte or metabolic Derangements. Cath specimen without evidence of u U TI. Chest x-ray is clear. CT abdomen and pelvis shows brain atrophy and chronic ischemic changes, but nothing acute. Take this is unlikely stroke. Patient however unable to ambulate with walker like he normally does at home. Requires a significant amount of assistance, which I do not feel is safe for him as he lives with his wife at home. Did speak with Dr. Marin Comment who will admit him for potential placement.   Final Clinical Impressions(s) / ED Diagnoses   Final diagnoses:  Alzheimer's dementia without behavioral disturbance, unspecified timing of dementia onset   New Prescriptions New Prescriptions   No medications on file   I personally performed the services described in this documentation, which was scribed in my presence. The recorded information has been reviewed and  is accurate.     Forde Dandy, MD 05/15/16 [phone]

## 2016-05-14 NOTE — ED Triage Notes (Signed)
Pt brought in by [facility] for c/o altered loc x 2 days; pt cbg 177; NIH stroke screen on field was negative; pt is only oriented to name, unable to give dob, date and year, or place

## 2016-05-15 ENCOUNTER — Encounter (HOSPITAL_COMMUNITY): Payer: Self-pay

## 2016-05-15 DIAGNOSIS — F0280 Dementia in other diseases classified elsewhere without behavioral disturbance: Secondary | ICD-10-CM

## 2016-05-15 DIAGNOSIS — G309 Alzheimer's disease, unspecified: Secondary | ICD-10-CM

## 2016-05-15 DIAGNOSIS — R4182 Altered mental status, unspecified: Secondary | ICD-10-CM | POA: Diagnosis present

## 2016-05-15 LAB — TSH: TSH: 3.984 u[IU]/mL (ref 0.350–4.500)

## 2016-05-15 MED ORDER — ENOXAPARIN SODIUM 40 MG/0.4ML ~~LOC~~ SOLN
40.0000 mg | SUBCUTANEOUS | Status: DC
  Filled 2016-05-15: qty 0.4

## 2016-05-15 MED ORDER — TAMSULOSIN HCL 0.4 MG PO CAPS: 0.4000 mg | ORAL_CAPSULE | Freq: Every day | ORAL | Status: DC

## 2016-05-15 MED ORDER — VITAMIN B-12 1000 MCG PO TABS: 1000.0000 ug | ORAL_TABLET | Freq: Every day | ORAL | Status: DC

## 2016-05-15 MED ORDER — MEMANTINE HCL 10 MG PO TABS
10.0000 mg | ORAL_TABLET | Freq: Two times a day (BID) | ORAL | Status: DC
  Administered 2016-05-15: 10 mg via ORAL
  Filled 2016-05-15: qty 1

## 2016-05-15 MED ORDER — ACETAMINOPHEN 325 MG PO TABS: 650.0000 mg | ORAL_TABLET | Freq: Every day | ORAL | Status: DC

## 2016-05-15 MED ORDER — ACETAMINOPHEN ER 650 MG PO TBCR: 650.0000 mg | EXTENDED_RELEASE_TABLET | Freq: Every day | ORAL | Status: DC

## 2016-05-15 MED ORDER — RIVASTIGMINE TARTRATE 3 MG PO CAPS
3.0000 mg | ORAL_CAPSULE | Freq: Two times a day (BID) | ORAL | Status: DC
  Administered 2016-05-15: 3 mg via ORAL
  Filled 2016-05-15 (×3): qty 1

## 2016-05-15 MED ORDER — ASPIRIN EC 81 MG PO TBEC
81.0000 mg | DELAYED_RELEASE_TABLET | Freq: Every day | ORAL | Status: DC
  Filled 2016-05-15: qty 1

## 2016-05-15 MED ORDER — CLOPIDOGREL BISULFATE 75 MG PO TABS
75.0000 mg | ORAL_TABLET | Freq: Every day | ORAL | Status: DC
  Administered 2016-05-15: 75 mg via ORAL
  Filled 2016-05-15: qty 1

## 2016-05-15 NOTE — Clinical Social Work Note (Signed)
Clinical Social Work Assessment  Patient Details  Name: Noah Bailey MRN: [id number] Date of Birth: 1929/04/22  Date of referral:  05/15/16               Reason for consult:  Discharge Planning                Permission sought to share information with:  Family Supports Permission granted to share information::  Yes, Verbal Permission Granted  Name::        Agency::     Relationship::  son, daughter-in-law  Contact Information:     Housing/Transportation Living arrangements for the past 2 months:  Single Family Home Source of Information:  Adult Children, Spouse Patient Interpreter Needed:  None Criminal Activity/Legal Involvement Pertinent to Current Situation/Hospitalization:  No - Comment as needed Significant Relationships:  Adult Children, Spouse Lives with:  Spouse Do you feel safe going back to the place where you live?  Yes Need for family participation in patient care:  Yes (Comment)  Care giving concerns:  Pt's wife is very emotional and states she is worn out from being up a lot with pt at night.    Social Worker assessment / plan:  CSW met with pt's wife and daughter-in-law at bedside. Pt's wife shared that they have been married for 48 years and she has tried to keep him at home for as long as possible. Recently, this has been more difficult as pt has been up a lot at night. They have several aids during the week, but pt's wife is only caregiver at night. At baseline, pt ambulates with a walker but yesterday he was unable to walk. Pt has dementia. Discussed PT recommendation for SNF and [facility] Medicare authorization process. Wife initially requested [facility], but is aware they have declined and now wants [facility].   Employment status:  Retired Nurse, adult PT Recommendations:  [place] / Referral to community resources:  [place]  Patient/Family's Response to care:  Pt's wife conflicted on  whether to pursue placement or take him back home. Pt's son feels strongly that he needs to go to SNF and wife agreed after he arrived.   Patient/Family's Understanding of and Emotional Response to Diagnosis, Current Treatment, and Prognosis:  Pt's wife and family are aware that pt is ready to d/c today. Pt's wife very emotional during assessment as she is having a hard time processing him being placed.   Emotional Assessment Appearance:  Appears stated age Attitude/Demeanor/Rapport:  Unable to Assess Affect (typically observed):  Unable to Assess Orientation:  Oriented to Self Alcohol / Substance use:  Not Applicable Psych involvement (Current and /or in the community):  No (Comment)  Discharge Needs  Concerns to be addressed:  Discharge Planning Concerns Readmission within the last 30 days:  No Current discharge risk:  Cognitively Impaired, Physical Impairment Barriers to Discharge:  Continued Medical Work up   Salome Arnt, [place] 05/15/2016, 1:31 PM [phone]

## 2016-05-15 NOTE — Clinical Social Work Placement (Signed)
   CLINICAL SOCIAL WORK PLACEMENT  NOTE  Date:  05/15/2016  Patient Details  Name: [name] MRN: [id number] Date of Birth: Jul 26, 1928  Clinical Social Work is seeking post-discharge placement for this patient at the [place] level of care (*CSW will initial, date and re-position this form in  chart as items are completed):  Yes   Patient/family provided with [place] Work Department's list of facilities offering this level of care within the geographic area requested by the patient (or if unable, by the patient's family).  Yes   Patient/family informed of their freedom to choose among providers that offer the needed level of care, that participate in Medicare, Medicaid or managed care program needed by the patient, have an available bed and are willing to accept the patient.  Yes   Patient/family informed of [place]'s ownership interest in [facility] and [facility], as well as of the fact that they are under no obligation to receive care at these facilities.  PASRR submitted to EDS on 05/15/16     PASRR number received on 05/15/16     Existing PASRR number confirmed on       FL2 transmitted to all facilities in geographic area requested by pt/family on 05/15/16     FL2 transmitted to all facilities within larger geographic area on       Patient informed that his/her managed care company has contracts with or will negotiate with certain facilities, including the following:            Patient/family informed of bed offers received.  Patient chooses bed at       Physician recommends and patient chooses bed at      Patient to be transferred to   on  .  Patient to be transferred to facility by       Patient family notified on   of transfer.  Name of family member notified:        PHYSICIAN       Additional Comment:    _______________________________________________ Salome Arnt, [place] 05/15/2016, 1:46  PM [phone]

## 2016-05-15 NOTE — Progress Notes (Signed)
Pt discharged to [facility]. Called report to [place]. Pt taken out by family. Oswald Hillock, RN

## 2016-05-15 NOTE — Discharge Summary (Addendum)
Physician Discharge Summary  Patient ID: Noah Bailey MRN: [id number] DOB/AGE: 80/31/1930 80 y.o. Primary Care Physician:Talicia Sui L, MD Admit date: 05/14/2016 Discharge date: 05/15/2016    Discharge Diagnoses:   Principal Problem:   Dementia due to Alzheimer's disease Active Problems:   Altered mental status TIA Chronic venous insufficiency Chronic neuropathic pain of legs BPH    Medication List    TAKE these medications   aspirin EC 81 MG tablet Take 81 mg by mouth daily.   clopidogrel 75 MG tablet Commonly known as:  PLAVIX Take 75 mg by mouth daily.   furosemide 40 MG tablet Commonly known as:  LASIX Take 40 mg by mouth daily as needed for fluid or edema.   gabapentin 300 MG capsule Commonly known as:  NEURONTIN Take 100 mg by mouth at bedtime.   memantine 10 MG tablet Commonly known as:  NAMENDA Take 10 mg by mouth 2 (two) times daily.   rivastigmine 3 MG capsule Commonly known as:  EXELON Take 3 mg by mouth 2 (two) times daily.   tamsulosin 0.4 MG Caps capsule Commonly known as:  FLOMAX Take 0.4 mg by mouth at bedtime.   TYLENOL 8 HOUR 650 MG CR tablet Generic drug:  acetaminophen Take 650 mg by mouth at bedtime.   vitamin B-12 1000 MCG tablet Commonly known as:  CYANOCOBALAMIN Take 1,000 mcg by mouth at bedtime.       Discharged Condition:unchanged    Consults:none  Significant Diagnostic Studies: Dg Chest 2 View  Result Date: 05/14/2016 CLINICAL DATA:  Progressive altered mental status with worsening orientation and confusion. EXAM: CHEST  2 VIEW COMPARISON:  Chest and left rib radiographs 06/16/2014 FINDINGS: The heart is enlarged. This is exaggerate by low lung volumes. Mild bibasilar atelectasis is present. The lungs are otherwise clear. There is no edema or effusion. The visualized soft tissues and bony thorax are unremarkable. IMPRESSION: 1. Low lung volumes. 2. Mild cardiomegaly without failure. 3. No acute cardiopulmonary  disease. Electronically Signed   By: San Morelle M.D.   On: 05/14/2016 22:01   Ct Head Wo Contrast  Result Date: 05/14/2016 CLINICAL DATA:  Altered mental status.  History of dementia. EXAM: CT HEAD WITHOUT CONTRAST TECHNIQUE: Contiguous axial images were obtained from the base of the skull through the vertex without intravenous contrast. COMPARISON:  04/03/2011 FINDINGS: BRAIN: There is sulcal and ventricular prominence consistent with superficial and central atrophy. The ventricles appear slightly more prominent than on prior exam suggesting progression of atrophy. No intraparenchymal hemorrhage, mass effect nor midline shift. There is also an increased amount of periventricular and subcortical white matter hypodensities consistent with progression of chronic small vessel ischemic disease. No acute large vascular territory infarcts. No abnormal extra-axial fluid collections. Basal cisterns are patent. VASCULAR: Moderate calcific atherosclerosis of the carotid siphons. SKULL: No skull fracture. No significant scalp soft tissue swelling. SINUSES/ORBITS: The mastoid air-cells are clear. The moderate ethmoid sinus mucosal thickening. Minimal mucosal thickening posteriorly within both maxillary sinuses. Antrostomy defects along both medial maxillary walls with bilateral middle turbinectomies. Bilateral lens replacements surgeries. OTHER: None. IMPRESSION: Progression of cerebral atrophy and chronic periventricular as well as subcortical white matter small vessel ischemia since 2012 CT. Bilateral sinus surgical changes involving both maxillary sinuses and middle turbinates. Chronic mild ethmoid sinusitis. Electronically Signed   By: Ashley Royalty M.D.   On: 05/14/2016 22:21    Lab Results: Basic Metabolic Panel:  Recent Labs  05/14/16 2109  NA 136  K 3.7  CL  102  CO2 29  GLUCOSE 129*  BUN 17  CREATININE 1.09  CALCIUM 8.5*   Liver Function Tests:  Recent Labs  05/14/16 2109  AST 22  ALT  25  ALKPHOS 66  BILITOT 0.6  PROT 7.5  ALBUMIN 3.4*     CBC:  Recent Labs  05/14/16 2109  WBC 8.4  NEUTROABS 6.3  HGB 12.0*  HCT 36.7*  MCV 96.1  PLT 205    No results found for this or any previous visit (from the past 240 hour(s)).   Hospital Course: this is an 82 yo with known alzheimers. He has been deteriorating as far as his ability to be managed at home. I saw him in my office on the sixth because he had 2 episodes of fairly sudden mental status change. It was felt that he probably had TIAs. At that point he was started on Plavix. I discussed workup of TIAs with his wife but considering his overall condition it was felt that he was not a candidate for any sort of invasive treatment and therefore workup was not done.. He had sudden deterioration on the night of admission, was not able to follow simple commands. His CT was negative but I believe that this was likely another TIA because he had not started the Plavix yet. He was brought in for observation and had PT and social work consults. It was felt he needed SNF placement and this was arranged  Discharge Exam: Blood pressure 125/70, pulse 85, temperature 98.4 F (36.9 C), temperature source Oral, resp. rate 18, SpO2 95 %. He is awake. Confused. Chest clear.  Disposition: to SNF for PT,OT, speech if needed. Regular diet      Signed: [place] Alia L   05/15/2016, 3:22 PM

## 2016-05-15 NOTE — ED Notes (Signed)
Attempted to walk pt with walker with myself and male tech pt very unsteady and he can not use the walker in a safe manner.

## 2016-05-15 NOTE — Care Management Obs Status (Signed)
[place] NOTIFICATION   Patient Details  Name: Noah Bailey MRN: [id number] Date of Birth: May 01, 1929   Medicare Observation Status Notification Given:  Yes    Porshe Fleagle, [place], RN 05/15/2016, 11:04 AM

## 2016-05-15 NOTE — Clinical Social Work Peds Assess (Signed)
CSW spoke with pt's family. Awaiting authorization from [facility]. Confirmed that auth request has been received and assigned to case manager. If approval not received before end of day, family will take pt home. MD and [facility] updated.   Benay Pike, [place]

## 2016-05-15 NOTE — H&P (Signed)
History and Physical    Noah Bailey [id number] DOB: [birth year] DOA: 05/14/2016  PCP: Alonza Bogus, MD  Patient coming from: Home.    Chief Complaint:  Altered mental status.  More uncooperative.   HPI: Noah Bailey is an 82 y.o. male with hx of Dementia, BPH, and nephrolithiasis, lives at home with his wife with help, saw Dr Luan Pulling today in the office, felt that his dementia may have gotten worse, and that it has become more difficult to care for him at home.  Wife and son at his bedside affirmed the fact that it is no longer possible for care for him at home.  Evaluation in the ER included a head CT with no acute process, UA negative for infection, and CXR was clear.  His serology was unrevealing.  He was alert, confused, and confabulated.  He was able to stand, but is not following simple verbal command.  He has no recent new medication nor new dosing regimen.   ED Course:  See above.  Past Medical History:  Diagnosis Date  . Arthritis   . BPH (benign prostatic hyperplasia)   . Hearing deficit    right hearing aid  . Lumbosacral spinal stenosis 06/22/2013  . Memory disturbance   . Renal calculi     Rewiew of Systems: Unable.   Past Surgical History:  Procedure Laterality Date  . BACK SURGERY     Cervical spine  . CATARACT EXTRACTION Bilateral   . LITHOTRIPSY    . POLYPECTOMY       reports that he has never smoked. He has never used smokeless tobacco. He reports that he does not drink alcohol or use drugs.  Allergies  Allergen Reactions  . Aricept [Donepezil Hcl]     Unknown reaction  . Ivp Dye [Iodinated Diagnostic Agents]     Unknown  . Namzaric [Memantine Hcl-Donepezil Hcl]     Unknown reaction    History reviewed. No pertinent family history.   Prior to Admission medications   Medication Sig Start Date End Date Taking? Authorizing Provider  acetaminophen (TYLENOL 8 HOUR) 650 MG CR tablet Take 650 mg by mouth at bedtime.   Yes Historical  Provider, MD  aspirin EC 81 MG tablet Take 81 mg by mouth daily.   Yes Historical Provider, MD  furosemide (LASIX) 40 MG tablet Take 40 mg by mouth daily as needed for fluid or edema.    Yes Historical Provider, MD  gabapentin (NEURONTIN) 300 MG capsule Take 100 mg by mouth at bedtime.   Yes Historical Provider, MD  memantine (NAMENDA) 10 MG tablet Take 10 mg by mouth 2 (two) times daily.   Yes Historical Provider, MD  rivastigmine (EXELON) 3 MG capsule Take 3 mg by mouth 2 (two) times daily.   Yes Historical Provider, MD  tamsulosin (FLOMAX) 0.4 MG CAPS capsule Take 0.4 mg by mouth at bedtime.   Yes Historical Provider, MD  vitamin B-12 (CYANOCOBALAMIN) 1000 MCG tablet Take 1,000 mcg by mouth at bedtime.    Yes Historical Provider, MD  clopidogrel (PLAVIX) 75 MG tablet Take 75 mg by mouth daily.    Historical Provider, MD    Physical Exam: Vitals:   05/14/16 2030 05/14/16 2255 05/14/16 2351 05/15/16 0000  BP: 150/77 132/81 118/94 124/92  Pulse: 98 93 102   Resp:  16 18   Temp: 98.5 F (36.9 C) 99.7 F (37.6 C) 98.6 F (37 C)   TempSrc: Oral Oral Oral   SpO2: 93% 92%  96%       Constitutional: NAD, calm, comfortable Vitals:   05/14/16 2030 05/14/16 2255 05/14/16 2351 05/15/16 0000  BP: 150/77 132/81 118/94 124/92  Pulse: 98 93 102   Resp:  16 18   Temp: 98.5 F (36.9 C) 99.7 F (37.6 C) 98.6 F (37 C)   TempSrc: Oral Oral Oral   SpO2: 93% 92% 96%    Eyes: PERRL, lids and conjunctivae normal ENMT: Mucous membranes are moist. Posterior pharynx clear of any exudate or lesions.Normal dentition.  Neck: normal, supple, no masses, no thyromegaly Respiratory: clear to auscultation bilaterally, no wheezing, no crackles. Normal respiratory effort. No accessory muscle use.  Cardiovascular: Regular rate and rhythm, no murmurs / rubs / gallops. No extremity edema. 2+ pedal pulses. No carotid bruits.  Abdomen: no tenderness, no masses palpated. No hepatosplenomegaly. Bowel sounds  positive.  Musculoskeletal: no clubbing / cyanosis. No joint deformity upper and lower extremities. Good ROM, no contractures. Normal muscle tone.  Skin: no rashes, lesions, ulcers. No induration Neurologic: CN 2-12 grossly intact. Sensation intact, DTR normal. Strength 5/5 in all 4.  Psychiatric: Normal judgment and insight. Alert and oriented x 3. Normal mood.    Labs on Admission: I have personally reviewed following labs and imaging studies CBC:  Recent Labs Lab 05/14/16 2109  WBC 8.4  NEUTROABS 6.3  HGB 12.0*  HCT 36.7*  MCV 96.1  PLT 99991111   Basic Metabolic Panel:  Recent Labs Lab 05/14/16 2109  NA 136  K 3.7  CL 102  CO2 29  GLUCOSE 129*  BUN 17  CREATININE 1.09  CALCIUM 8.5*   GFR: CrCl cannot be calculated (Unknown ideal weight.). Liver Function Tests:  Recent Labs Lab 05/14/16 2109  AST 22  ALT 25  ALKPHOS 66  BILITOT 0.6  PROT 7.5  ALBUMIN 3.4*   Urine analysis:    Component Value Date/Time   COLORURINE YELLOW 05/14/2016 2107   APPEARANCEUR HAZY (A) 05/14/2016 2107   LABSPEC 1.017 05/14/2016 2107   PHURINE 7.0 05/14/2016 2107   GLUCOSEU NEGATIVE 05/14/2016 2107   HGBUR MODERATE (A) 05/14/2016 2107   BILIRUBINUR NEGATIVE 05/14/2016 2107   [place] NEGATIVE 05/14/2016 2107   PROTEINUR 30 (A) 05/14/2016 2107   UROBILINOGEN 0.2 01/26/2011 0946   NITRITE NEGATIVE 05/14/2016 2107   LEUKOCYTESUR NEGATIVE 05/14/2016 2107    Radiological Exams on Admission: Dg Chest 2 View  Result Date: 05/14/2016 CLINICAL DATA:  Progressive altered mental status with worsening orientation and confusion. EXAM: CHEST  2 VIEW COMPARISON:  Chest and left rib radiographs 06/16/2014 FINDINGS: The heart is enlarged. This is exaggerate by low lung volumes. Mild bibasilar atelectasis is present. The lungs are otherwise clear. There is no edema or effusion. The visualized soft tissues and bony thorax are unremarkable. IMPRESSION: 1. Low lung volumes. 2. Mild cardiomegaly  without failure. 3. No acute cardiopulmonary disease. Electronically Signed   By: San Morelle M.D.   On: 05/14/2016 22:01   Ct Head Wo Contrast  Result Date: 05/14/2016 CLINICAL DATA:  Altered mental status.  History of dementia. EXAM: CT HEAD WITHOUT CONTRAST TECHNIQUE: Contiguous axial images were obtained from the base of the skull through the vertex without intravenous contrast. COMPARISON:  04/03/2011 FINDINGS: BRAIN: There is sulcal and ventricular prominence consistent with superficial and central atrophy. The ventricles appear slightly more prominent than on prior exam suggesting progression of atrophy. No intraparenchymal hemorrhage, mass effect nor midline shift. There is also an increased amount of periventricular and subcortical white matter  hypodensities consistent with progression of chronic small vessel ischemic disease. No acute large vascular territory infarcts. No abnormal extra-axial fluid collections. Basal cisterns are patent. VASCULAR: Moderate calcific atherosclerosis of the carotid siphons. SKULL: No skull fracture. No significant scalp soft tissue swelling. SINUSES/ORBITS: The mastoid air-cells are clear. The moderate ethmoid sinus mucosal thickening. Minimal mucosal thickening posteriorly within both maxillary sinuses. Antrostomy defects along both medial maxillary walls with bilateral middle turbinectomies. Bilateral lens replacements surgeries. OTHER: None. IMPRESSION: Progression of cerebral atrophy and chronic periventricular as well as subcortical white matter small vessel ischemia since 2012 CT. Bilateral sinus surgical changes involving both maxillary sinuses and middle turbinates. Chronic mild ethmoid sinusitis. Electronically Signed   By: Ashley Royalty M.D.   On: 05/14/2016 22:21    EKG: Independently reviewed.   Assessment/Plan Principal Problem:   Dementia due to Alzheimer's disease Active Problems:   Altered mental status   PLAN:   Altered mental status:   I suspect he has progression of his AD. He has no agitation or behavior problem, but is not following simple verbal command, and is not cooperating.  No acute findings in the ER.  Family is agreeable for placement at this point.  Will continue with all his meds.  He is otherwise stable.  Work ups have been negative thus far.  Will obtain physical therapy evaluation.     DVT prophylaxis: lovenox.  Code Status: FULL CODE.  Family Communication: son and wife at bedside.  Disposition Plan: SNF.  Consults called: None. Admission status: OBS.    Aiyana Stegmann MD FACP. [facility]  If 7PM-7AM, please contact night-coverage [email or web address] Password [email or web address]  05/15/2016, 1:59 AM

## 2016-05-15 NOTE — Progress Notes (Signed)
This is an assumption of care note. This is a patient that I saw in my office yesterday. There was concern that he might have been having TIA. After discussion with his wife it was elected not to work this up but simply to add Plavix to his medications. After he got home he was not able to ambulate and did not follow simple commands. CT does not show any acute abnormality. PT consult pending. Will discuss with social services

## 2016-05-15 NOTE — Evaluation (Signed)
Physical Therapy Evaluation Patient Details Name: Noah Bailey MRN: [id number] DOB: [birth year] Today's Date: 05/15/2016   History of Present Illness  82 y.o. male with hx of Dementia, BPH, and nephrolithiasis, lives at home with his wife with help, saw Dr Luan Pulling today in the office, felt that his dementia may have gotten worse, and that it has become more difficult to care for him at home.  Wife and son at his bedside affirmed the fact that it is no longer possible for care for him at home.  Evaluation in the ER included a head CT with no acute process, UA negative for infection, and CXR was clear.  His serology was unrevealing.  He was alert, confused, and confabulated.   Clinical Impression  Pt received sitting up in the chair, wife, and son present, and pt is agreeable to PT evaluation.  Family expressed that for the past several weeks, he has required the use of a RW for ambulation, and he is essentially home bound.  His wife and aides assist him with all ADL's.  He has aides that come for several hours in the AM, and then again in the PM.  His wife assists him when the aides are not present, however he has required increased level of assistance recently, and she is not able to care for him at this point.  Pt is recommended for SNF to improve strength, and endurance to be able to assist with his care.      Follow Up Recommendations SNF    Equipment Recommendations  None recommended by PT    Recommendations for Other Services       Precautions / Restrictions Precautions Precautions: Fall Restrictions Weight Bearing Restrictions: No      Mobility  Bed Mobility Overal bed mobility:  (Not assessed. )                Transfers Overall transfer level: Needs assistance Equipment used: Rolling walker (2 wheeled) Transfers: Sit to/from Stand Sit to Stand: Min assist;Mod assist         General transfer comment: vc's for hand placement.  Pt had difficulty with getting into  the bathroom.  He expressed need to urinate, and PT was assisting him into the bathroom.  Just prior to reaching the commode, he became incontinent.  Ambulation/Gait Ambulation/Gait assistance: Min assist Ambulation Distance (Feet): 80 Feet Assistive device: Rolling walker (2 wheeled) Gait Pattern/deviations: Step-through pattern;Trunk flexed;Shuffle     General Gait Details: Pt requires multiple vc's to keep RW closer to his body, however he was not able to comprehend, therefore he required constant assistance for RW negotiation    Stairs            Wheelchair Mobility    Modified Rankin (Stroke Patients Only)       Balance Overall balance assessment: Needs assistance         Standing balance support: Bilateral upper extremity supported Standing balance-Leahy Scale: Poor                               Pertinent Vitals/Pain Pain Assessment: No/denies pain    Home Living   Living Arrangements: Spouse/significant other Available Help at Discharge: Home health;Available PRN/intermittently (5-6 hours in the AM, and 3 hours at night.  This is nearly everyday.  )         Home Layout: Two level;Able to live on main level with bedroom/bathroom Home Equipment: Bedside  commode;Walker - 2 wheels;Cane - single point;Shower seat - built in;Grab bars - toilet;Grab bars - tub/shower      Prior Function Level of Independence: Independent with assistive device(s);Needs assistance   Gait / Transfers Assistance Needed: The past several weeks he has been using the RW.  Household ambulator.   ADL's / Homemaking Assistance Needed: Aides assist with dressing, and bathing.          Hand Dominance   Dominant Hand: Right    Extremity/Trunk Assessment   Upper Extremity Assessment: Overall WFL for tasks assessed           Lower Extremity Assessment: Generalized weakness      Cervical / Trunk Assessment: Kyphotic  Communication      Cognition  Arousal/Alertness: Awake/alert Behavior During Therapy: WFL for tasks assessed/performed Overall Cognitive Status: History of cognitive impairments - at baseline Area of Impairment: Orientation Orientation Level: Disoriented to;Place;Time;Situation (Pt states that he thinks he is in [place]. )                  General Comments      Exercises     Assessment/Plan    PT Assessment Patient needs continued PT services  PT Problem List Decreased strength;Decreased activity tolerance;Decreased balance;Decreased mobility;Decreased cognition;Decreased knowledge of use of DME;Decreased safety awareness;Decreased knowledge of precautions;Decreased skin integrity          PT Treatment Interventions DME instruction;Gait training;Functional mobility training;Therapeutic activities;Therapeutic exercise;Balance training;Cognitive remediation;Patient/family education    PT Goals (Current goals can be found in the Care Plan section)  Acute Rehab PT Goals Patient Stated Goal: Family's goal is to keep the patient at home for as long as possible.  Discussed the need for SNF to get rehab prior to returning home.  PT Goal Formulation: With family Time For Goal Achievement: 05/22/16 Potential to Achieve Goals: Fair    Frequency Min 3X/week   Barriers to discharge        Co-evaluation               End of Session Equipment Utilized During Treatment: Gait belt Activity Tolerance: Patient tolerated treatment well Patient left: in chair;with call bell/phone within reach;with family/visitor present Nurse Communication: Mobility status Apolonio Schneiders, RN notified of pt's mobility status, as well as location. )    Functional Assessment Tool Used: [employer] "6-clicks"  Functional Limitation: Mobility: Walking and moving around Mobility: Walking and Moving Around Current Status [phone]): At least 40 percent but less than 60 percent impaired, limited or restricted Mobility: Walking  and Moving Around Goal Status [phone]): At least 20 percent but less than 40 percent impaired, limited or restricted    Time: 1119-1207 PT Time Calculation (min) (ACUTE ONLY): 48 min   Charges:   PT Evaluation $PT Eval Low Complexity: 1 Procedure PT Treatments $Gait Training: 8-22 mins $Self Care/Home Management: 8-22   PT G Codes:   PT G-Codes **NOT FOR INPATIENT CLASS** Functional Assessment Tool Used: [employer] "6-clicks"  Functional Limitation: Mobility: Walking and moving around Mobility: Walking and Moving Around Current Status [phone]): At least 40 percent but less than 60 percent impaired, limited or restricted Mobility: Walking and Moving Around Goal Status [phone]): At least 20 percent but less than 40 percent impaired, limited or restricted    Beth Tamanika Heiney, PT, DPT X: [phone]

## 2016-05-15 NOTE — [place] FL2 (Signed)
  [place] MEDICAID FL2 LEVEL OF CARE SCREENING TOOL     IDENTIFICATION  Patient Name: Noah Bailey Birthdate: 08-05-1928 Sex: male Admission Date (Current Location): 05/14/2016  [facility] and [place] Number:  [employer] and Address:  [facility] [address] [place]      Provider Number: [id number]  Attending Physician Name and Address:  Sinda Du, MD  Relative Name and Phone Number:       Current Level of Care: Hospital Recommended Level of Care: [place] Prior Approval Number:    Date Approved/Denied:   PASRR Number: [id number]  Discharge Plan: SNF    Current Diagnoses: Patient Active Problem List   Diagnosis Date Noted  . Dementia due to Alzheimer's disease 05/15/2016  . Altered mental status 05/15/2016  . Lumbosacral spinal stenosis 06/22/2013  . Disturbance of skin sensation 06/22/2013  . Acquired trigger finger 09/30/2011  . OA (osteoarthritis) of knee 09/30/2011  . Arthritis of knee 06/19/2011    Orientation RESPIRATION BLADDER Height & Weight     Self  Normal Incontinent Weight:   Height:     BEHAVIORAL SYMPTOMS/MOOD NEUROLOGICAL BOWEL NUTRITION STATUS  Other (Comment) (none)  (n/a) Incontinent Diet (Regular)  AMBULATORY STATUS COMMUNICATION OF NEEDS Skin   Extensive Assist Verbally Normal                       Personal Care Assistance Level of Assistance  Bathing, Feeding, Dressing Bathing Assistance: Maximum assistance Feeding assistance: Maximum assistance Dressing Assistance: Maximum assistance     Functional Limitations Info  Sight, Hearing, Speech Sight Info: Impaired Hearing Info: Impaired Speech Info: Adequate    SPECIAL CARE FACTORS FREQUENCY  PT (By licensed PT)                    Contractures Contractures Info: Not present    Additional Factors Info  Code Status, Allergies Code Status Info: Full code Allergies Info: Aricept (Donepezil Hcl), Ivp Dye  (Iodinated Diagnostic Agents), Namzaric (Memantine Hcl-donepezil Hcl)           Current Medications (05/15/2016):  This is the current hospital active medication list Current Facility-Administered Medications  Medication Dose Route Frequency Provider Last Rate Last Dose  . acetaminophen (TYLENOL) tablet 650 mg  650 mg Oral QHS Orvan Falconer, MD      . aspirin EC tablet 81 mg  81 mg Oral Daily Orvan Falconer, MD      . clopidogrel (PLAVIX) tablet 75 mg  75 mg Oral Daily Orvan Falconer, MD   75 mg at 05/15/16 0943  . enoxaparin (LOVENOX) injection 40 mg  40 mg Subcutaneous Q24H Orvan Falconer, MD      . memantine [facility]) tablet 10 mg  10 mg Oral BID Orvan Falconer, MD   10 mg at 05/15/16 0943  . rivastigmine (EXELON) capsule 3 mg  3 mg Oral BID Orvan Falconer, MD   3 mg at 05/15/16 0945  . tamsulosin (FLOMAX) capsule 0.4 mg  0.4 mg Oral QHS Orvan Falconer, MD      . vitamin B-12 (CYANOCOBALAMIN) tablet 1,000 mcg  1,000 mcg Oral QHS Orvan Falconer, MD         Discharge Medications: Please see discharge summary for a list of discharge medications.  Relevant Imaging Results:  Relevant Lab Results:   Additional Information SSN: [id number]. Pt has long term care policy.   Benay Pike [place], [place]

## 2016-05-15 NOTE — Care Management Note (Signed)
Case Management Note  Patient Details  Name: Noah Bailey MRN: [id number] Date of Birth: March 05, 1929  Subjective/Objective:    Patient adm from home with  C/o AMS and uncooperative behavior due to progressive dementia. Patient has walker at home PTA, which he has had to use over the last week. Patient has aide M-F 6 hours a day and aide at nights.    Action/Plan: Anticipate DC to ALF. Will follow for needs.  Expected Discharge Date:     05/15/2016             Expected Discharge Plan:  Assisted Living / Rest Home  In-House Referral:  Clinical Social Work  Discharge planning Services  CM Consult  Post Acute Care Choice:    Choice offered to:     DME Arranged:    DME Agency:     HH Arranged:    HH Agency:     Status of Service:  In process, will continue to follow  If discussed at Long Length of Stay Meetings, dates discussed:    Additional Comments:  Santrice Muzio, [place]un[place], RN 05/15/2016, 11:04 AM

## 2016-05-16 NOTE — Clinical Social Work Note (Signed)
Late entry for 05/15/16:  Discussed with case manager at [facility] who reports SNF request may go to peer-to-peer. Shared information with Gerald Stabs at [facility] who reports okay to send pt to facility and they will work with insurance Friday as long as family understands if denied, they will have to pay privately or take pt home. CSW spoke with pt's son who is in agreement.  Benay Pike, [place]

## 2016-06-09 DIAGNOSIS — G459 Transient cerebral ischemic attack, unspecified: Secondary | ICD-10-CM | POA: Diagnosis not present

## 2016-06-09 DIAGNOSIS — C649 Malignant neoplasm of unspecified kidney, except renal pelvis: Secondary | ICD-10-CM | POA: Diagnosis not present

## 2016-06-09 DIAGNOSIS — R21 Rash and other nonspecific skin eruption: Secondary | ICD-10-CM | POA: Diagnosis not present

## 2016-06-09 DIAGNOSIS — G309 Alzheimer's disease, unspecified: Secondary | ICD-10-CM | POA: Diagnosis not present

## 2016-06-10 DIAGNOSIS — I872 Venous insufficiency (chronic) (peripheral): Secondary | ICD-10-CM | POA: Diagnosis not present

## 2016-06-10 DIAGNOSIS — G309 Alzheimer's disease, unspecified: Secondary | ICD-10-CM | POA: Diagnosis not present

## 2016-06-10 DIAGNOSIS — G629 Polyneuropathy, unspecified: Secondary | ICD-10-CM | POA: Diagnosis not present

## 2016-06-10 DIAGNOSIS — Z8673 Personal history of transient ischemic attack (TIA), and cerebral infarction without residual deficits: Secondary | ICD-10-CM | POA: Diagnosis not present

## 2016-06-12 DIAGNOSIS — G309 Alzheimer's disease, unspecified: Secondary | ICD-10-CM | POA: Diagnosis not present

## 2016-06-12 DIAGNOSIS — G629 Polyneuropathy, unspecified: Secondary | ICD-10-CM | POA: Diagnosis not present

## 2016-06-12 DIAGNOSIS — Z8673 Personal history of transient ischemic attack (TIA), and cerebral infarction without residual deficits: Secondary | ICD-10-CM | POA: Diagnosis not present

## 2016-06-12 DIAGNOSIS — I872 Venous insufficiency (chronic) (peripheral): Secondary | ICD-10-CM | POA: Diagnosis not present

## 2016-06-21 DIAGNOSIS — G309 Alzheimer's disease, unspecified: Secondary | ICD-10-CM | POA: Diagnosis not present

## 2016-06-24 DIAGNOSIS — I739 Peripheral vascular disease, unspecified: Secondary | ICD-10-CM | POA: Diagnosis not present

## 2016-07-10 DIAGNOSIS — G459 Transient cerebral ischemic attack, unspecified: Secondary | ICD-10-CM | POA: Diagnosis not present

## 2016-07-10 DIAGNOSIS — G309 Alzheimer's disease, unspecified: Secondary | ICD-10-CM | POA: Diagnosis not present

## 2016-07-10 DIAGNOSIS — R21 Rash and other nonspecific skin eruption: Secondary | ICD-10-CM | POA: Diagnosis not present

## 2016-07-10 DIAGNOSIS — M79669 Pain in unspecified lower leg: Secondary | ICD-10-CM | POA: Diagnosis not present

## 2016-07-10 DIAGNOSIS — C649 Malignant neoplasm of unspecified kidney, except renal pelvis: Secondary | ICD-10-CM | POA: Diagnosis not present

## 2016-07-10 DIAGNOSIS — M545 Low back pain: Secondary | ICD-10-CM | POA: Diagnosis not present

## 2016-07-15 DIAGNOSIS — N401 Enlarged prostate with lower urinary tract symptoms: Secondary | ICD-10-CM | POA: Diagnosis not present

## 2016-07-15 DIAGNOSIS — G459 Transient cerebral ischemic attack, unspecified: Secondary | ICD-10-CM | POA: Diagnosis not present

## 2016-07-15 DIAGNOSIS — M545 Low back pain: Secondary | ICD-10-CM | POA: Diagnosis not present

## 2016-07-15 DIAGNOSIS — G309 Alzheimer's disease, unspecified: Secondary | ICD-10-CM | POA: Diagnosis not present

## 2016-07-22 DIAGNOSIS — G309 Alzheimer's disease, unspecified: Secondary | ICD-10-CM | POA: Diagnosis not present

## 2016-08-07 DIAGNOSIS — R21 Rash and other nonspecific skin eruption: Secondary | ICD-10-CM | POA: Diagnosis not present

## 2016-08-07 DIAGNOSIS — G459 Transient cerebral ischemic attack, unspecified: Secondary | ICD-10-CM | POA: Diagnosis not present

## 2016-08-07 DIAGNOSIS — G309 Alzheimer's disease, unspecified: Secondary | ICD-10-CM | POA: Diagnosis not present

## 2016-08-07 DIAGNOSIS — C649 Malignant neoplasm of unspecified kidney, except renal pelvis: Secondary | ICD-10-CM | POA: Diagnosis not present

## 2016-08-19 DIAGNOSIS — G309 Alzheimer's disease, unspecified: Secondary | ICD-10-CM | POA: Diagnosis not present

## 2016-09-02 DIAGNOSIS — I739 Peripheral vascular disease, unspecified: Secondary | ICD-10-CM | POA: Diagnosis not present

## 2016-09-07 DIAGNOSIS — C649 Malignant neoplasm of unspecified kidney, except renal pelvis: Secondary | ICD-10-CM | POA: Diagnosis not present

## 2016-09-07 DIAGNOSIS — G459 Transient cerebral ischemic attack, unspecified: Secondary | ICD-10-CM | POA: Diagnosis not present

## 2016-09-07 DIAGNOSIS — R21 Rash and other nonspecific skin eruption: Secondary | ICD-10-CM | POA: Diagnosis not present

## 2016-09-07 DIAGNOSIS — G309 Alzheimer's disease, unspecified: Secondary | ICD-10-CM | POA: Diagnosis not present

## 2016-09-19 DIAGNOSIS — G309 Alzheimer's disease, unspecified: Secondary | ICD-10-CM | POA: Diagnosis not present

## 2016-09-25 ENCOUNTER — Ambulatory Visit (INDEPENDENT_AMBULATORY_CARE_PROVIDER_SITE_OTHER): Payer: Medicare Other | Admitting: Otolaryngology

## 2016-09-25 DIAGNOSIS — H6123 Impacted cerumen, bilateral: Secondary | ICD-10-CM

## 2016-10-07 DIAGNOSIS — R21 Rash and other nonspecific skin eruption: Secondary | ICD-10-CM | POA: Diagnosis not present

## 2016-10-07 DIAGNOSIS — G459 Transient cerebral ischemic attack, unspecified: Secondary | ICD-10-CM | POA: Diagnosis not present

## 2016-10-07 DIAGNOSIS — G309 Alzheimer's disease, unspecified: Secondary | ICD-10-CM | POA: Diagnosis not present

## 2016-10-07 DIAGNOSIS — C649 Malignant neoplasm of unspecified kidney, except renal pelvis: Secondary | ICD-10-CM | POA: Diagnosis not present

## 2016-10-19 DIAGNOSIS — G309 Alzheimer's disease, unspecified: Secondary | ICD-10-CM | POA: Diagnosis not present

## 2016-10-23 DIAGNOSIS — M545 Low back pain: Secondary | ICD-10-CM | POA: Diagnosis not present

## 2016-10-23 DIAGNOSIS — N401 Enlarged prostate with lower urinary tract symptoms: Secondary | ICD-10-CM | POA: Diagnosis not present

## 2016-10-23 DIAGNOSIS — G309 Alzheimer's disease, unspecified: Secondary | ICD-10-CM | POA: Diagnosis not present

## 2016-10-23 DIAGNOSIS — G629 Polyneuropathy, unspecified: Secondary | ICD-10-CM | POA: Diagnosis not present

## 2016-10-27 DIAGNOSIS — M4807 Spinal stenosis, lumbosacral region: Secondary | ICD-10-CM | POA: Diagnosis not present

## 2016-10-27 DIAGNOSIS — G459 Transient cerebral ischemic attack, unspecified: Secondary | ICD-10-CM | POA: Diagnosis not present

## 2016-11-06 DIAGNOSIS — K921 Melena: Secondary | ICD-10-CM | POA: Diagnosis not present

## 2016-11-07 DIAGNOSIS — R21 Rash and other nonspecific skin eruption: Secondary | ICD-10-CM | POA: Diagnosis not present

## 2016-11-07 DIAGNOSIS — G309 Alzheimer's disease, unspecified: Secondary | ICD-10-CM | POA: Diagnosis not present

## 2016-11-07 DIAGNOSIS — C649 Malignant neoplasm of unspecified kidney, except renal pelvis: Secondary | ICD-10-CM | POA: Diagnosis not present

## 2016-11-07 DIAGNOSIS — G459 Transient cerebral ischemic attack, unspecified: Secondary | ICD-10-CM | POA: Diagnosis not present

## 2016-12-05 DIAGNOSIS — R197 Diarrhea, unspecified: Secondary | ICD-10-CM | POA: Diagnosis not present

## 2017-07-10 ENCOUNTER — Emergency Department (HOSPITAL_COMMUNITY)
Admission: EM | Admit: 2017-07-10 | Discharge: 2017-07-10 | Disposition: A | Discharge status: Home / Self Care | Attending: Emergency Medicine | Admitting: Emergency Medicine

## 2017-07-10 ENCOUNTER — Other Ambulatory Visit: Payer: Self-pay

## 2017-07-10 ENCOUNTER — Encounter (HOSPITAL_COMMUNITY): Payer: Self-pay | Admitting: Emergency Medicine

## 2017-07-10 DIAGNOSIS — Z79899 Other long term (current) drug therapy: Secondary | ICD-10-CM | POA: Insufficient documentation

## 2017-07-10 DIAGNOSIS — G309 Alzheimer's disease, unspecified: Secondary | ICD-10-CM | POA: Insufficient documentation

## 2017-07-10 DIAGNOSIS — Z7982 Long term (current) use of aspirin: Secondary | ICD-10-CM | POA: Insufficient documentation

## 2017-07-10 DIAGNOSIS — R197 Diarrhea, unspecified: Secondary | ICD-10-CM | POA: Diagnosis not present

## 2017-07-10 DIAGNOSIS — R404 Transient alteration of awareness: Secondary | ICD-10-CM | POA: Diagnosis not present

## 2017-07-10 DIAGNOSIS — F0280 Dementia in other diseases classified elsewhere without behavioral disturbance: Secondary | ICD-10-CM | POA: Diagnosis not present

## 2017-07-10 DIAGNOSIS — Z7902 Long term (current) use of antithrombotics/antiplatelets: Secondary | ICD-10-CM | POA: Insufficient documentation

## 2017-07-10 DIAGNOSIS — R1110 Vomiting, unspecified: Secondary | ICD-10-CM | POA: Diagnosis present

## 2017-07-10 DIAGNOSIS — R531 Weakness: Secondary | ICD-10-CM | POA: Diagnosis not present

## 2017-07-10 DIAGNOSIS — R112 Nausea with vomiting, unspecified: Secondary | ICD-10-CM | POA: Diagnosis not present

## 2017-07-10 HISTORY — DX: Alzheimer's disease, unspecified: G30.9

## 2017-07-10 HISTORY — DX: Dementia in other diseases classified elsewhere, unspecified severity, without behavioral disturbance, psychotic disturbance, mood disturbance, and anxiety: F02.80

## 2017-07-10 LAB — URINALYSIS, ROUTINE W REFLEX MICROSCOPIC
BILIRUBIN URINE: NEGATIVE
GLUCOSE, UA: NEGATIVE mg/dL
HGB URINE DIPSTICK: NEGATIVE
Ketones, ur: NEGATIVE mg/dL
Leukocytes, UA: NEGATIVE
Nitrite: NEGATIVE
Protein, ur: NEGATIVE mg/dL
SPECIFIC GRAVITY, URINE: 1.014 (ref 1.005–1.030)
pH: 5 (ref 5.0–8.0)

## 2017-07-10 LAB — CBC WITH DIFFERENTIAL/PLATELET
BASOS PCT: 0 %
Basophils Absolute: 0 10*3/uL (ref 0.0–0.1)
EOS ABS: 0 10*3/uL (ref 0.0–0.7)
Eosinophils Relative: 1 %
HEMATOCRIT: 38.4 % — AB (ref 39.0–52.0)
HEMOGLOBIN: 12.3 g/dL — AB (ref 13.0–17.0)
LYMPHS ABS: 1.5 10*3/uL (ref 0.7–4.0)
Lymphocytes Relative: 23 %
MCH: 30.9 pg (ref 26.0–34.0)
MCHC: 32 g/dL (ref 30.0–36.0)
MCV: 96.5 fL (ref 78.0–100.0)
MONO ABS: 0.4 10*3/uL (ref 0.1–1.0)
MONOS PCT: 6 %
Neutro Abs: 4.8 10*3/uL (ref 1.7–7.7)
Neutrophils Relative %: 70 %
Platelets: 203 10*3/uL (ref 150–400)
RBC: 3.98 MIL/uL — ABNORMAL LOW (ref 4.22–5.81)
RDW: 13.9 % (ref 11.5–15.5)
WBC: 6.8 10*3/uL (ref 4.0–10.5)

## 2017-07-10 LAB — COMPREHENSIVE METABOLIC PANEL
ALBUMIN: 3.4 g/dL — AB (ref 3.5–5.0)
ALK PHOS: 66 U/L (ref 38–126)
ALT: 19 U/L (ref 17–63)
AST: 17 U/L (ref 15–41)
Anion gap: 8 (ref 5–15)
BUN: 13 mg/dL (ref 6–20)
CALCIUM: 8.1 mg/dL — AB (ref 8.9–10.3)
CHLORIDE: 105 mmol/L (ref 101–111)
CO2: 25 mmol/L (ref 22–32)
CREATININE: 0.88 mg/dL (ref 0.61–1.24)
GFR calc Af Amer: >60 mL/min (ref >60)
GFR calc non Af Amer: >60 mL/min (ref >60)
GLUCOSE: 94 mg/dL (ref 65–99)
Potassium: 3.8 mmol/L (ref 3.5–5.1)
SODIUM: 138 mmol/L (ref 135–145)
Total Bilirubin: 0.6 mg/dL (ref 0.3–1.2)
Total Protein: 7 g/dL (ref 6.5–8.1)

## 2017-07-10 LAB — LIPASE, BLOOD: LIPASE: 26 U/L (ref 11–51)

## 2017-07-10 MED ORDER — SODIUM CHLORIDE 0.9 % IV BOLUS (SEPSIS)
1000.0000 mL | Freq: Once | INTRAVENOUS | Status: AC
  Administered 2017-07-10: 1000 mL via INTRAVENOUS

## 2017-07-10 NOTE — Discharge Instructions (Signed)
As discussed, your evaluation today has been largely reassuring.  But, it is important that you monitor your condition carefully, and do not hesitate to return to the ED if you develop new, or concerning changes in your condition. ? ?Otherwise, please follow-up with your physician for appropriate ongoing care. ? ?

## 2017-07-10 NOTE — ED Notes (Signed)
Assisted pt with urinal

## 2017-07-10 NOTE — ED Provider Notes (Signed)
[facility] EMERGENCY DEPARTMENT Provider Note   CSN: [id number] Arrival date & time: 07/10/17  1240     History   Chief Complaint Chief Complaint  Patient presents with  . Emesis    HPI Noah Bailey is a 84 y.o. male.  HPI Patient with dementia, level 5 caveat presents due to reported nausea, vomiting, diarrhea. Patient himself is oriented only to self, but does provide simple answers to some questions. Notably, the patient denies pain, discomfort, complaints. However, the patient reportedly had several episodes of vomiting and diarrhea over the past few hours. This history is provided by hospice care providers. Patient's wife is also currently being evaluated for nausea, vomiting, diarrhea in the facility.  Past Medical History:  Diagnosis Date  . Alzheimer disease   . Arthritis   . BPH (benign prostatic hyperplasia)   . Hearing deficit    right hearing aid  . Lumbosacral spinal stenosis 06/22/2013  . Memory disturbance   . Renal calculi     Patient Active Problem List   Diagnosis Date Noted  . Dementia due to Alzheimer's disease 05/15/2016  . Altered mental status 05/15/2016  . Lumbosacral spinal stenosis 06/22/2013  . Disturbance of skin sensation 06/22/2013  . Acquired trigger finger 09/30/2011  . OA (osteoarthritis) of knee 09/30/2011  . Arthritis of knee 06/19/2011    Past Surgical History:  Procedure Laterality Date  . BACK SURGERY     Cervical spine  . CATARACT EXTRACTION Bilateral   . LITHOTRIPSY    . POLYPECTOMY         Home Medications    Prior to Admission medications   Medication Sig Start Date End Date Taking? Authorizing Provider  acetaminophen (TYLENOL 8 HOUR) 650 MG CR tablet Take 650 mg by mouth at bedtime.    [provider]  aspirin EC 81 MG tablet Take 81 mg by mouth daily.    [provider]  clopidogrel (PLAVIX) 75 MG tablet Take 75 mg by mouth daily.    [provider]  furosemide (LASIX) 40 MG  tablet Take 40 mg by mouth daily as needed for fluid or edema.     [provider]  gabapentin (NEURONTIN) 300 MG capsule Take 100 mg by mouth at bedtime.    [provider]  memantine (NAMENDA) 10 MG tablet Take 10 mg by mouth 2 (two) times daily.    [provider]  rivastigmine (EXELON) 3 MG capsule Take 3 mg by mouth 2 (two) times daily.    [provider]  tamsulosin (FLOMAX) 0.4 MG CAPS capsule Take 0.4 mg by mouth at bedtime.    [provider]  vitamin B-12 (CYANOCOBALAMIN) 1000 MCG tablet Take 1,000 mcg by mouth at bedtime.     [provider]    Family History No family history on file.  Social History Social History   Tobacco Use  . Smoking status: Never Smoker  . Smokeless tobacco: Never Used  Substance Use Topics  . Alcohol use: No  . Drug use: No     Allergies   Aricept [donepezil hcl]; Ivp dye [iodinated diagnostic agents]; and Namzaric [memantine hcl-donepezil hcl]   Review of Systems Review of Systems  Unable to perform ROS: Dementia     Physical Exam Updated Vital Signs BP (!) 148/81 (BP Location: Left Arm)   Pulse 80   Temp (!) 97.5 F (36.4 C) (Oral)   Resp 16   Wt 93 kg (205 lb)   SpO2 97%  BMI 31.17 kg/m   Physical Exam  Constitutional: He appears well-developed. No distress.  HENT:  Head: Normocephalic and atraumatic.  Eyes: Conjunctivae and EOM are normal.  Cardiovascular: Normal rate and regular rhythm.  Pulmonary/Chest: Effort normal. No stridor. No respiratory distress.  Abdominal: He exhibits no distension. There is no tenderness. There is no guarding.  Musculoskeletal: He exhibits no edema.  Neurological: He is alert.  Oriented to self only, moves all extremities spontaneously, has no facial asymmetry, speech, when spoken is brief.  Skin: Skin is warm and dry.  Psychiatric: He is withdrawn. Cognition and memory are impaired.  Nursing note and vitals reviewed.    ED  Treatments / Results  Labs (all labs ordered are listed, but only abnormal results are displayed) Labs Reviewed  COMPREHENSIVE METABOLIC PANEL - Abnormal; Notable for the following components:      Result Value   Calcium 8.1 (*)    Albumin 3.4 (*)    All other components within normal limits  CBC WITH DIFFERENTIAL/PLATELET - Abnormal; Notable for the following components:   RBC 3.98 (*)    Hemoglobin 12.3 (*)    HCT 38.4 (*)    All other components within normal limits  LIPASE, BLOOD  URINALYSIS, ROUTINE W REFLEX MICROSCOPIC    Procedures Procedures (including critical care time)  Medications Ordered in ED Medications  sodium chloride 0.9 % bolus 1,000 mL (not administered)     Initial Impression / Assessment and Plan / ED Course  I have reviewed the triage vital signs and the nursing notes.  Pertinent labs & imaging results that were available during my care of the patient were reviewed by me and considered in my medical decision making (see chart for details).  Update: Family member and hospice nurse both present. History of the patient had several episodes of diarrhea at home, was more agitated than usual. On repeat exam the patient is calm, still with no complaints, remains hemodynamically unremarkable initial labs reassuring. Urinalysis pending.  Update:, Urinalysis unremarkable.  Patient still has had no additional diarrhea here, remains calm, hemodynamically unremarkable. I had a lengthy conversation with the patient's son, about the reassuring findings, absence of hemodynamic instability, absence of complaints, with a soft, non-peritoneal abdomen, absence of suspicion for acute abdominal processes, though he may have additional diarrhea. No evidence for ongoing systemic illness. Hospice attempted to find a respite care bed, but none are currently available. Patient does not meet criteria for admission, was discharged to his home with continued outpatient hospice  follow-up.    Final Clinical Impressions(s) / ED Diagnoses   Final diagnoses:  Diarrhea in adult patient     Carmin Muskrat, MD 07/10/17 [phone]

## 2017-07-10 NOTE — ED Triage Notes (Signed)
[facility] brings pt from home, Hospice pt, wife admitted currently with same symptoms, Nausea, vomiting and diarrhea. Hospice care called EMS, states pt has been more confused, yelling. Pt oriented to Name only.

## 2017-07-10 NOTE — ED Notes (Signed)
Hospice nurse visiting with patient and son

## 2017-07-10 NOTE — ED Notes (Signed)
EMS gave zofran 4mg  in route

## 2017-07-10 NOTE — ED Notes (Signed)
Pt dressed and assisted in wheelchair, Patient given discharge instruction, verbalized understand. Patient ambulatory out of the department.

## 2017-10-08 DIAGNOSIS — Z23 Encounter for immunization: Secondary | ICD-10-CM | POA: Diagnosis not present

## 2017-10-08 DIAGNOSIS — N401 Enlarged prostate with lower urinary tract symptoms: Secondary | ICD-10-CM | POA: Diagnosis not present

## 2017-10-08 DIAGNOSIS — G629 Polyneuropathy, unspecified: Secondary | ICD-10-CM | POA: Diagnosis not present

## 2017-10-08 DIAGNOSIS — Z8673 Personal history of transient ischemic attack (TIA), and cerebral infarction without residual deficits: Secondary | ICD-10-CM | POA: Diagnosis not present

## 2017-10-08 DIAGNOSIS — G309 Alzheimer's disease, unspecified: Secondary | ICD-10-CM | POA: Diagnosis not present

## 2017-10-23 DIAGNOSIS — M545 Low back pain: Secondary | ICD-10-CM | POA: Diagnosis not present

## 2017-10-23 DIAGNOSIS — G309 Alzheimer's disease, unspecified: Secondary | ICD-10-CM | POA: Diagnosis not present

## 2017-10-23 DIAGNOSIS — N401 Enlarged prostate with lower urinary tract symptoms: Secondary | ICD-10-CM | POA: Diagnosis not present

## 2017-10-23 DIAGNOSIS — C649 Malignant neoplasm of unspecified kidney, except renal pelvis: Secondary | ICD-10-CM | POA: Diagnosis not present

## 2017-10-23 DIAGNOSIS — G459 Transient cerebral ischemic attack, unspecified: Secondary | ICD-10-CM | POA: Diagnosis not present

## 2017-10-23 DIAGNOSIS — G629 Polyneuropathy, unspecified: Secondary | ICD-10-CM | POA: Diagnosis not present

## 2018-01-22 DIAGNOSIS — G309 Alzheimer's disease, unspecified: Secondary | ICD-10-CM | POA: Diagnosis not present

## 2018-01-23 DIAGNOSIS — R410 Disorientation, unspecified: Secondary | ICD-10-CM | POA: Diagnosis not present

## 2018-01-26 DIAGNOSIS — G309 Alzheimer's disease, unspecified: Secondary | ICD-10-CM | POA: Diagnosis not present

## 2018-01-26 DIAGNOSIS — M6281 Muscle weakness (generalized): Secondary | ICD-10-CM | POA: Diagnosis not present

## 2018-01-26 DIAGNOSIS — R1311 Dysphagia, oral phase: Secondary | ICD-10-CM | POA: Diagnosis not present

## 2018-01-27 DIAGNOSIS — R1311 Dysphagia, oral phase: Secondary | ICD-10-CM | POA: Diagnosis not present

## 2018-01-27 DIAGNOSIS — M6281 Muscle weakness (generalized): Secondary | ICD-10-CM | POA: Diagnosis not present

## 2018-01-27 DIAGNOSIS — G309 Alzheimer's disease, unspecified: Secondary | ICD-10-CM | POA: Diagnosis not present

## 2018-01-28 DIAGNOSIS — M6281 Muscle weakness (generalized): Secondary | ICD-10-CM | POA: Diagnosis not present

## 2018-01-28 DIAGNOSIS — G309 Alzheimer's disease, unspecified: Secondary | ICD-10-CM | POA: Diagnosis not present

## 2018-01-28 DIAGNOSIS — R1311 Dysphagia, oral phase: Secondary | ICD-10-CM | POA: Diagnosis not present

## 2018-01-29 DIAGNOSIS — G309 Alzheimer's disease, unspecified: Secondary | ICD-10-CM | POA: Diagnosis not present

## 2018-01-29 DIAGNOSIS — M6281 Muscle weakness (generalized): Secondary | ICD-10-CM | POA: Diagnosis not present

## 2018-01-29 DIAGNOSIS — R1311 Dysphagia, oral phase: Secondary | ICD-10-CM | POA: Diagnosis not present

## 2018-01-30 DIAGNOSIS — R1311 Dysphagia, oral phase: Secondary | ICD-10-CM | POA: Diagnosis not present

## 2018-01-30 DIAGNOSIS — G309 Alzheimer's disease, unspecified: Secondary | ICD-10-CM | POA: Diagnosis not present

## 2018-01-30 DIAGNOSIS — M6281 Muscle weakness (generalized): Secondary | ICD-10-CM | POA: Diagnosis not present

## 2018-02-01 DIAGNOSIS — M6281 Muscle weakness (generalized): Secondary | ICD-10-CM | POA: Diagnosis not present

## 2018-02-01 DIAGNOSIS — R1311 Dysphagia, oral phase: Secondary | ICD-10-CM | POA: Diagnosis not present

## 2018-02-01 DIAGNOSIS — G309 Alzheimer's disease, unspecified: Secondary | ICD-10-CM | POA: Diagnosis not present

## 2018-02-02 DIAGNOSIS — G309 Alzheimer's disease, unspecified: Secondary | ICD-10-CM | POA: Diagnosis not present

## 2018-02-02 DIAGNOSIS — M6281 Muscle weakness (generalized): Secondary | ICD-10-CM | POA: Diagnosis not present

## 2018-02-02 DIAGNOSIS — R1311 Dysphagia, oral phase: Secondary | ICD-10-CM | POA: Diagnosis not present

## 2018-02-03 DIAGNOSIS — R1311 Dysphagia, oral phase: Secondary | ICD-10-CM | POA: Diagnosis not present

## 2018-02-03 DIAGNOSIS — M6281 Muscle weakness (generalized): Secondary | ICD-10-CM | POA: Diagnosis not present

## 2018-02-03 DIAGNOSIS — G309 Alzheimer's disease, unspecified: Secondary | ICD-10-CM | POA: Diagnosis not present

## 2018-02-04 DIAGNOSIS — M6281 Muscle weakness (generalized): Secondary | ICD-10-CM | POA: Diagnosis not present

## 2018-02-04 DIAGNOSIS — R1311 Dysphagia, oral phase: Secondary | ICD-10-CM | POA: Diagnosis not present

## 2018-02-04 DIAGNOSIS — G309 Alzheimer's disease, unspecified: Secondary | ICD-10-CM | POA: Diagnosis not present

## 2018-02-05 DIAGNOSIS — M6281 Muscle weakness (generalized): Secondary | ICD-10-CM | POA: Diagnosis not present

## 2018-02-05 DIAGNOSIS — R1311 Dysphagia, oral phase: Secondary | ICD-10-CM | POA: Diagnosis not present

## 2018-02-05 DIAGNOSIS — G309 Alzheimer's disease, unspecified: Secondary | ICD-10-CM | POA: Diagnosis not present

## 2018-02-06 DIAGNOSIS — G309 Alzheimer's disease, unspecified: Secondary | ICD-10-CM | POA: Diagnosis not present

## 2018-02-06 DIAGNOSIS — R1311 Dysphagia, oral phase: Secondary | ICD-10-CM | POA: Diagnosis not present

## 2018-02-06 DIAGNOSIS — M6281 Muscle weakness (generalized): Secondary | ICD-10-CM | POA: Diagnosis not present

## 2018-02-08 DIAGNOSIS — M6281 Muscle weakness (generalized): Secondary | ICD-10-CM | POA: Diagnosis not present

## 2018-02-08 DIAGNOSIS — R1311 Dysphagia, oral phase: Secondary | ICD-10-CM | POA: Diagnosis not present

## 2018-02-08 DIAGNOSIS — G309 Alzheimer's disease, unspecified: Secondary | ICD-10-CM | POA: Diagnosis not present

## 2018-02-09 DIAGNOSIS — R1311 Dysphagia, oral phase: Secondary | ICD-10-CM | POA: Diagnosis not present

## 2018-02-09 DIAGNOSIS — G309 Alzheimer's disease, unspecified: Secondary | ICD-10-CM | POA: Diagnosis not present

## 2018-02-09 DIAGNOSIS — M6281 Muscle weakness (generalized): Secondary | ICD-10-CM | POA: Diagnosis not present

## 2018-02-10 DIAGNOSIS — R1311 Dysphagia, oral phase: Secondary | ICD-10-CM | POA: Diagnosis not present

## 2018-02-10 DIAGNOSIS — G309 Alzheimer's disease, unspecified: Secondary | ICD-10-CM | POA: Diagnosis not present

## 2018-02-10 DIAGNOSIS — M6281 Muscle weakness (generalized): Secondary | ICD-10-CM | POA: Diagnosis not present

## 2018-02-11 DIAGNOSIS — M6281 Muscle weakness (generalized): Secondary | ICD-10-CM | POA: Diagnosis not present

## 2018-02-11 DIAGNOSIS — R1311 Dysphagia, oral phase: Secondary | ICD-10-CM | POA: Diagnosis not present

## 2018-02-11 DIAGNOSIS — G309 Alzheimer's disease, unspecified: Secondary | ICD-10-CM | POA: Diagnosis not present

## 2018-02-12 DIAGNOSIS — R1311 Dysphagia, oral phase: Secondary | ICD-10-CM | POA: Diagnosis not present

## 2018-02-12 DIAGNOSIS — M6281 Muscle weakness (generalized): Secondary | ICD-10-CM | POA: Diagnosis not present

## 2018-02-12 DIAGNOSIS — G309 Alzheimer's disease, unspecified: Secondary | ICD-10-CM | POA: Diagnosis not present

## 2018-02-13 DIAGNOSIS — M6281 Muscle weakness (generalized): Secondary | ICD-10-CM | POA: Diagnosis not present

## 2018-02-13 DIAGNOSIS — G309 Alzheimer's disease, unspecified: Secondary | ICD-10-CM | POA: Diagnosis not present

## 2018-02-13 DIAGNOSIS — R1311 Dysphagia, oral phase: Secondary | ICD-10-CM | POA: Diagnosis not present

## 2018-02-15 DIAGNOSIS — M6281 Muscle weakness (generalized): Secondary | ICD-10-CM | POA: Diagnosis not present

## 2018-02-15 DIAGNOSIS — R1311 Dysphagia, oral phase: Secondary | ICD-10-CM | POA: Diagnosis not present

## 2018-02-15 DIAGNOSIS — G309 Alzheimer's disease, unspecified: Secondary | ICD-10-CM | POA: Diagnosis not present

## 2018-02-16 DIAGNOSIS — M6281 Muscle weakness (generalized): Secondary | ICD-10-CM | POA: Diagnosis not present

## 2018-02-16 DIAGNOSIS — G309 Alzheimer's disease, unspecified: Secondary | ICD-10-CM | POA: Diagnosis not present

## 2018-02-16 DIAGNOSIS — R1311 Dysphagia, oral phase: Secondary | ICD-10-CM | POA: Diagnosis not present

## 2018-02-17 DIAGNOSIS — M6281 Muscle weakness (generalized): Secondary | ICD-10-CM | POA: Diagnosis not present

## 2018-02-17 DIAGNOSIS — R1311 Dysphagia, oral phase: Secondary | ICD-10-CM | POA: Diagnosis not present

## 2018-02-17 DIAGNOSIS — G309 Alzheimer's disease, unspecified: Secondary | ICD-10-CM | POA: Diagnosis not present

## 2018-02-18 DIAGNOSIS — R1311 Dysphagia, oral phase: Secondary | ICD-10-CM | POA: Diagnosis not present

## 2018-02-18 DIAGNOSIS — M6281 Muscle weakness (generalized): Secondary | ICD-10-CM | POA: Diagnosis not present

## 2018-02-18 DIAGNOSIS — G309 Alzheimer's disease, unspecified: Secondary | ICD-10-CM | POA: Diagnosis not present

## 2018-02-19 DIAGNOSIS — G309 Alzheimer's disease, unspecified: Secondary | ICD-10-CM | POA: Diagnosis not present

## 2018-02-19 DIAGNOSIS — M6281 Muscle weakness (generalized): Secondary | ICD-10-CM | POA: Diagnosis not present

## 2018-02-19 DIAGNOSIS — R1311 Dysphagia, oral phase: Secondary | ICD-10-CM | POA: Diagnosis not present

## 2018-02-22 DIAGNOSIS — M6281 Muscle weakness (generalized): Secondary | ICD-10-CM | POA: Diagnosis not present

## 2018-02-22 DIAGNOSIS — R1311 Dysphagia, oral phase: Secondary | ICD-10-CM | POA: Diagnosis not present

## 2018-02-22 DIAGNOSIS — G309 Alzheimer's disease, unspecified: Secondary | ICD-10-CM | POA: Diagnosis not present

## 2018-02-23 DIAGNOSIS — R1311 Dysphagia, oral phase: Secondary | ICD-10-CM | POA: Diagnosis not present

## 2018-02-23 DIAGNOSIS — M6281 Muscle weakness (generalized): Secondary | ICD-10-CM | POA: Diagnosis not present

## 2018-02-23 DIAGNOSIS — G309 Alzheimer's disease, unspecified: Secondary | ICD-10-CM | POA: Diagnosis not present

## 2018-02-24 DIAGNOSIS — M6281 Muscle weakness (generalized): Secondary | ICD-10-CM | POA: Diagnosis not present

## 2018-02-24 DIAGNOSIS — G309 Alzheimer's disease, unspecified: Secondary | ICD-10-CM | POA: Diagnosis not present

## 2018-02-24 DIAGNOSIS — R1311 Dysphagia, oral phase: Secondary | ICD-10-CM | POA: Diagnosis not present

## 2018-02-25 DIAGNOSIS — G309 Alzheimer's disease, unspecified: Secondary | ICD-10-CM | POA: Diagnosis not present

## 2018-02-25 DIAGNOSIS — R1311 Dysphagia, oral phase: Secondary | ICD-10-CM | POA: Diagnosis not present

## 2018-02-25 DIAGNOSIS — M6281 Muscle weakness (generalized): Secondary | ICD-10-CM | POA: Diagnosis not present

## 2018-02-26 DIAGNOSIS — R1311 Dysphagia, oral phase: Secondary | ICD-10-CM | POA: Diagnosis not present

## 2018-02-26 DIAGNOSIS — M6281 Muscle weakness (generalized): Secondary | ICD-10-CM | POA: Diagnosis not present

## 2018-02-26 DIAGNOSIS — G309 Alzheimer's disease, unspecified: Secondary | ICD-10-CM | POA: Diagnosis not present

## 2018-03-01 DIAGNOSIS — G309 Alzheimer's disease, unspecified: Secondary | ICD-10-CM | POA: Diagnosis not present

## 2018-03-01 DIAGNOSIS — M6281 Muscle weakness (generalized): Secondary | ICD-10-CM | POA: Diagnosis not present

## 2018-03-01 DIAGNOSIS — R1311 Dysphagia, oral phase: Secondary | ICD-10-CM | POA: Diagnosis not present

## 2018-03-02 DIAGNOSIS — G309 Alzheimer's disease, unspecified: Secondary | ICD-10-CM | POA: Diagnosis not present

## 2018-03-02 DIAGNOSIS — R1311 Dysphagia, oral phase: Secondary | ICD-10-CM | POA: Diagnosis not present

## 2018-03-02 DIAGNOSIS — M6281 Muscle weakness (generalized): Secondary | ICD-10-CM | POA: Diagnosis not present

## 2018-03-03 DIAGNOSIS — R1311 Dysphagia, oral phase: Secondary | ICD-10-CM | POA: Diagnosis not present

## 2018-03-03 DIAGNOSIS — G309 Alzheimer's disease, unspecified: Secondary | ICD-10-CM | POA: Diagnosis not present

## 2018-03-03 DIAGNOSIS — M6281 Muscle weakness (generalized): Secondary | ICD-10-CM | POA: Diagnosis not present

## 2018-03-04 DIAGNOSIS — M6281 Muscle weakness (generalized): Secondary | ICD-10-CM | POA: Diagnosis not present

## 2018-03-04 DIAGNOSIS — G309 Alzheimer's disease, unspecified: Secondary | ICD-10-CM | POA: Diagnosis not present

## 2018-03-04 DIAGNOSIS — R1311 Dysphagia, oral phase: Secondary | ICD-10-CM | POA: Diagnosis not present

## 2018-03-05 DIAGNOSIS — M6281 Muscle weakness (generalized): Secondary | ICD-10-CM | POA: Diagnosis not present

## 2018-03-05 DIAGNOSIS — G309 Alzheimer's disease, unspecified: Secondary | ICD-10-CM | POA: Diagnosis not present

## 2018-03-05 DIAGNOSIS — R1311 Dysphagia, oral phase: Secondary | ICD-10-CM | POA: Diagnosis not present

## 2018-03-08 DIAGNOSIS — R1311 Dysphagia, oral phase: Secondary | ICD-10-CM | POA: Diagnosis not present

## 2018-03-08 DIAGNOSIS — M6281 Muscle weakness (generalized): Secondary | ICD-10-CM | POA: Diagnosis not present

## 2018-03-08 DIAGNOSIS — G309 Alzheimer's disease, unspecified: Secondary | ICD-10-CM | POA: Diagnosis not present

## 2018-03-09 DIAGNOSIS — Z23 Encounter for immunization: Secondary | ICD-10-CM | POA: Diagnosis not present

## 2018-03-10 DIAGNOSIS — R1311 Dysphagia, oral phase: Secondary | ICD-10-CM | POA: Diagnosis not present

## 2018-03-10 DIAGNOSIS — M6281 Muscle weakness (generalized): Secondary | ICD-10-CM | POA: Diagnosis not present

## 2018-03-10 DIAGNOSIS — G309 Alzheimer's disease, unspecified: Secondary | ICD-10-CM | POA: Diagnosis not present

## 2018-03-11 DIAGNOSIS — R1311 Dysphagia, oral phase: Secondary | ICD-10-CM | POA: Diagnosis not present

## 2018-03-11 DIAGNOSIS — G309 Alzheimer's disease, unspecified: Secondary | ICD-10-CM | POA: Diagnosis not present

## 2018-03-11 DIAGNOSIS — M6281 Muscle weakness (generalized): Secondary | ICD-10-CM | POA: Diagnosis not present

## 2018-03-12 DIAGNOSIS — M6281 Muscle weakness (generalized): Secondary | ICD-10-CM | POA: Diagnosis not present

## 2018-03-12 DIAGNOSIS — R1311 Dysphagia, oral phase: Secondary | ICD-10-CM | POA: Diagnosis not present

## 2018-03-12 DIAGNOSIS — G309 Alzheimer's disease, unspecified: Secondary | ICD-10-CM | POA: Diagnosis not present

## 2018-03-15 DIAGNOSIS — G309 Alzheimer's disease, unspecified: Secondary | ICD-10-CM | POA: Diagnosis not present

## 2018-03-15 DIAGNOSIS — R1311 Dysphagia, oral phase: Secondary | ICD-10-CM | POA: Diagnosis not present

## 2018-03-15 DIAGNOSIS — M6281 Muscle weakness (generalized): Secondary | ICD-10-CM | POA: Diagnosis not present

## 2018-03-16 DIAGNOSIS — R1311 Dysphagia, oral phase: Secondary | ICD-10-CM | POA: Diagnosis not present

## 2018-03-16 DIAGNOSIS — M6281 Muscle weakness (generalized): Secondary | ICD-10-CM | POA: Diagnosis not present

## 2018-03-16 DIAGNOSIS — G309 Alzheimer's disease, unspecified: Secondary | ICD-10-CM | POA: Diagnosis not present

## 2018-03-17 DIAGNOSIS — G309 Alzheimer's disease, unspecified: Secondary | ICD-10-CM | POA: Diagnosis not present

## 2018-03-17 DIAGNOSIS — R1311 Dysphagia, oral phase: Secondary | ICD-10-CM | POA: Diagnosis not present

## 2018-03-17 DIAGNOSIS — M6281 Muscle weakness (generalized): Secondary | ICD-10-CM | POA: Diagnosis not present

## 2018-03-18 DIAGNOSIS — R1311 Dysphagia, oral phase: Secondary | ICD-10-CM | POA: Diagnosis not present

## 2018-03-18 DIAGNOSIS — M6281 Muscle weakness (generalized): Secondary | ICD-10-CM | POA: Diagnosis not present

## 2018-03-18 DIAGNOSIS — G309 Alzheimer's disease, unspecified: Secondary | ICD-10-CM | POA: Diagnosis not present

## 2018-07-15 DIAGNOSIS — B351 Tinea unguium: Secondary | ICD-10-CM | POA: Diagnosis not present

## 2018-07-15 DIAGNOSIS — R262 Difficulty in walking, not elsewhere classified: Secondary | ICD-10-CM | POA: Diagnosis not present

## 2018-07-15 DIAGNOSIS — R600 Localized edema: Secondary | ICD-10-CM | POA: Diagnosis not present

## 2018-07-15 DIAGNOSIS — L853 Xerosis cutis: Secondary | ICD-10-CM | POA: Diagnosis not present

## 2018-07-15 DIAGNOSIS — I739 Peripheral vascular disease, unspecified: Secondary | ICD-10-CM | POA: Diagnosis not present

## 2018-07-27 DIAGNOSIS — R062 Wheezing: Secondary | ICD-10-CM | POA: Diagnosis not present

## 2018-07-27 DIAGNOSIS — R0689 Other abnormalities of breathing: Secondary | ICD-10-CM | POA: Diagnosis not present

## 2018-07-27 DIAGNOSIS — R404 Transient alteration of awareness: Secondary | ICD-10-CM | POA: Diagnosis not present

## 2018-07-27 DIAGNOSIS — J189 Pneumonia, unspecified organism: Secondary | ICD-10-CM | POA: Diagnosis not present

## 2018-07-27 DIAGNOSIS — R410 Disorientation, unspecified: Secondary | ICD-10-CM | POA: Diagnosis not present

## 2018-07-27 DIAGNOSIS — R000 Tachycardia, unspecified: Secondary | ICD-10-CM | POA: Diagnosis not present

## 2018-07-27 DIAGNOSIS — I959 Hypotension, unspecified: Secondary | ICD-10-CM | POA: Diagnosis not present

## 2018-07-28 DIAGNOSIS — Z8673 Personal history of transient ischemic attack (TIA), and cerebral infarction without residual deficits: Secondary | ICD-10-CM | POA: Diagnosis not present

## 2018-07-28 DIAGNOSIS — R410 Disorientation, unspecified: Secondary | ICD-10-CM | POA: Diagnosis not present

## 2018-07-28 DIAGNOSIS — G309 Alzheimer's disease, unspecified: Secondary | ICD-10-CM | POA: Diagnosis not present

## 2018-07-28 DIAGNOSIS — J189 Pneumonia, unspecified organism: Secondary | ICD-10-CM | POA: Diagnosis not present

## 2018-07-28 DIAGNOSIS — Z7902 Long term (current) use of antithrombotics/antiplatelets: Secondary | ICD-10-CM | POA: Diagnosis not present

## 2018-07-28 DIAGNOSIS — R532 Functional quadriplegia: Secondary | ICD-10-CM | POA: Diagnosis not present

## 2018-07-28 DIAGNOSIS — G629 Polyneuropathy, unspecified: Secondary | ICD-10-CM | POA: Diagnosis not present

## 2018-07-28 DIAGNOSIS — Z66 Do not resuscitate: Secondary | ICD-10-CM | POA: Diagnosis not present

## 2018-07-28 DIAGNOSIS — J129 Viral pneumonia, unspecified: Secondary | ICD-10-CM | POA: Diagnosis not present

## 2018-07-28 DIAGNOSIS — Z79899 Other long term (current) drug therapy: Secondary | ICD-10-CM | POA: Diagnosis not present

## 2018-10-12 DIAGNOSIS — R1319 Other dysphagia: Secondary | ICD-10-CM | POA: Diagnosis not present

## 2018-10-13 DIAGNOSIS — R1319 Other dysphagia: Secondary | ICD-10-CM | POA: Diagnosis not present

## 2018-10-14 DIAGNOSIS — R1319 Other dysphagia: Secondary | ICD-10-CM | POA: Diagnosis not present

## 2018-10-19 DIAGNOSIS — R1319 Other dysphagia: Secondary | ICD-10-CM | POA: Diagnosis not present

## 2018-10-20 DIAGNOSIS — R1319 Other dysphagia: Secondary | ICD-10-CM | POA: Diagnosis not present

## 2018-10-21 DIAGNOSIS — R1319 Other dysphagia: Secondary | ICD-10-CM | POA: Diagnosis not present

## 2018-10-23 DIAGNOSIS — Z8709 Personal history of other diseases of the respiratory system: Secondary | ICD-10-CM | POA: Diagnosis not present

## 2018-10-23 DIAGNOSIS — I509 Heart failure, unspecified: Secondary | ICD-10-CM | POA: Diagnosis not present

## 2018-10-26 DIAGNOSIS — R1319 Other dysphagia: Secondary | ICD-10-CM | POA: Diagnosis not present

## 2018-10-27 DIAGNOSIS — R1319 Other dysphagia: Secondary | ICD-10-CM | POA: Diagnosis not present

## 2018-10-28 DIAGNOSIS — R1319 Other dysphagia: Secondary | ICD-10-CM | POA: Diagnosis not present

## 2018-11-02 DIAGNOSIS — R1319 Other dysphagia: Secondary | ICD-10-CM | POA: Diagnosis not present

## 2018-11-03 DIAGNOSIS — R1319 Other dysphagia: Secondary | ICD-10-CM | POA: Diagnosis not present

## 2018-12-14 DIAGNOSIS — Z8673 Personal history of transient ischemic attack (TIA), and cerebral infarction without residual deficits: Secondary | ICD-10-CM | POA: Diagnosis not present

## 2018-12-28 DIAGNOSIS — Z8673 Personal history of transient ischemic attack (TIA), and cerebral infarction without residual deficits: Secondary | ICD-10-CM | POA: Diagnosis not present

## 2018-12-28 DIAGNOSIS — G309 Alzheimer's disease, unspecified: Secondary | ICD-10-CM | POA: Diagnosis not present

## 2019-01-13 DIAGNOSIS — Z79899 Other long term (current) drug therapy: Secondary | ICD-10-CM | POA: Diagnosis not present

## 2019-01-13 DIAGNOSIS — Z5181 Encounter for therapeutic drug level monitoring: Secondary | ICD-10-CM | POA: Diagnosis not present

## 2019-02-10 DIAGNOSIS — G309 Alzheimer's disease, unspecified: Secondary | ICD-10-CM | POA: Diagnosis not present

## 2019-02-10 DIAGNOSIS — Z8673 Personal history of transient ischemic attack (TIA), and cerebral infarction without residual deficits: Secondary | ICD-10-CM | POA: Diagnosis not present

## 2019-08-23 DIAGNOSIS — E039 Hypothyroidism, unspecified: Secondary | ICD-10-CM | POA: Diagnosis not present

## 2019-08-23 DIAGNOSIS — L02212 Cutaneous abscess of back [any part, except buttock]: Secondary | ICD-10-CM | POA: Diagnosis not present

## 2019-09-06 DIAGNOSIS — S31000A Unspecified open wound of lower back and pelvis without penetration into retroperitoneum, initial encounter: Secondary | ICD-10-CM | POA: Diagnosis not present

## 2019-09-06 DIAGNOSIS — L089 Local infection of the skin and subcutaneous tissue, unspecified: Secondary | ICD-10-CM | POA: Diagnosis not present

## 2019-09-08 DIAGNOSIS — G4700 Insomnia, unspecified: Secondary | ICD-10-CM | POA: Diagnosis not present

## 2019-09-13 DIAGNOSIS — Z792 Long term (current) use of antibiotics: Secondary | ICD-10-CM | POA: Diagnosis not present

## 2019-09-13 DIAGNOSIS — Z5181 Encounter for therapeutic drug level monitoring: Secondary | ICD-10-CM | POA: Diagnosis not present

## 2019-09-18 DIAGNOSIS — Z5181 Encounter for therapeutic drug level monitoring: Secondary | ICD-10-CM | POA: Diagnosis not present

## 2019-09-18 DIAGNOSIS — Z792 Long term (current) use of antibiotics: Secondary | ICD-10-CM | POA: Diagnosis not present

## 2019-09-20 DIAGNOSIS — L89150 Pressure ulcer of sacral region, unstageable: Secondary | ICD-10-CM | POA: Diagnosis not present

## 2019-09-20 DIAGNOSIS — S51011A Laceration without foreign body of right elbow, initial encounter: Secondary | ICD-10-CM | POA: Diagnosis not present

## 2019-09-22 DIAGNOSIS — Z5181 Encounter for therapeutic drug level monitoring: Secondary | ICD-10-CM | POA: Diagnosis not present

## 2019-09-22 DIAGNOSIS — Z792 Long term (current) use of antibiotics: Secondary | ICD-10-CM | POA: Diagnosis not present

## 2019-09-27 DIAGNOSIS — L89150 Pressure ulcer of sacral region, unstageable: Secondary | ICD-10-CM | POA: Diagnosis not present

## 2019-09-27 DIAGNOSIS — H9190 Unspecified hearing loss, unspecified ear: Secondary | ICD-10-CM | POA: Diagnosis not present

## 2019-10-04 DIAGNOSIS — Z91041 Radiographic dye allergy status: Secondary | ICD-10-CM | POA: Diagnosis not present

## 2019-10-04 DIAGNOSIS — L89153 Pressure ulcer of sacral region, stage 3: Secondary | ICD-10-CM | POA: Diagnosis not present

## 2019-10-04 DIAGNOSIS — Z79899 Other long term (current) drug therapy: Secondary | ICD-10-CM | POA: Diagnosis not present

## 2019-10-04 DIAGNOSIS — Z7902 Long term (current) use of antithrombotics/antiplatelets: Secondary | ICD-10-CM | POA: Diagnosis not present

## 2019-10-06 DIAGNOSIS — G309 Alzheimer's disease, unspecified: Secondary | ICD-10-CM | POA: Diagnosis not present

## 2019-10-06 DIAGNOSIS — G4700 Insomnia, unspecified: Secondary | ICD-10-CM | POA: Diagnosis not present

## 2019-11-01 DIAGNOSIS — L89153 Pressure ulcer of sacral region, stage 3: Secondary | ICD-10-CM | POA: Diagnosis not present

## 2019-11-22 DIAGNOSIS — D692 Other nonthrombocytopenic purpura: Secondary | ICD-10-CM | POA: Diagnosis not present

## 2019-11-22 DIAGNOSIS — Z299 Encounter for prophylactic measures, unspecified: Secondary | ICD-10-CM | POA: Diagnosis not present

## 2019-11-22 DIAGNOSIS — L8993 Pressure ulcer of unspecified site, stage 3: Secondary | ICD-10-CM | POA: Diagnosis not present

## 2019-12-04 DIAGNOSIS — D692 Other nonthrombocytopenic purpura: Secondary | ICD-10-CM | POA: Diagnosis not present

## 2019-12-04 DIAGNOSIS — I739 Peripheral vascular disease, unspecified: Secondary | ICD-10-CM | POA: Diagnosis not present

## 2019-12-06 DIAGNOSIS — Z299 Encounter for prophylactic measures, unspecified: Secondary | ICD-10-CM | POA: Diagnosis not present

## 2019-12-06 DIAGNOSIS — Z0000 Encounter for general adult medical examination without abnormal findings: Secondary | ICD-10-CM | POA: Diagnosis not present

## 2019-12-06 DIAGNOSIS — Z7189 Other specified counseling: Secondary | ICD-10-CM | POA: Diagnosis not present

## 2019-12-06 DIAGNOSIS — G629 Polyneuropathy, unspecified: Secondary | ICD-10-CM | POA: Diagnosis not present

## 2019-12-06 DIAGNOSIS — I739 Peripheral vascular disease, unspecified: Secondary | ICD-10-CM | POA: Diagnosis not present

## 2019-12-08 DIAGNOSIS — G459 Transient cerebral ischemic attack, unspecified: Secondary | ICD-10-CM | POA: Diagnosis not present

## 2019-12-08 DIAGNOSIS — Z299 Encounter for prophylactic measures, unspecified: Secondary | ICD-10-CM | POA: Diagnosis not present

## 2019-12-08 DIAGNOSIS — K5900 Constipation, unspecified: Secondary | ICD-10-CM | POA: Diagnosis not present

## 2019-12-27 DIAGNOSIS — L89153 Pressure ulcer of sacral region, stage 3: Secondary | ICD-10-CM | POA: Diagnosis not present

## 2019-12-27 DIAGNOSIS — Z791 Long term (current) use of non-steroidal anti-inflammatories (NSAID): Secondary | ICD-10-CM | POA: Diagnosis not present

## 2019-12-27 DIAGNOSIS — L89159 Pressure ulcer of sacral region, unspecified stage: Secondary | ICD-10-CM | POA: Diagnosis not present

## 2019-12-27 DIAGNOSIS — S81812A Laceration without foreign body, left lower leg, initial encounter: Secondary | ICD-10-CM | POA: Diagnosis not present

## 2019-12-27 DIAGNOSIS — Z7902 Long term (current) use of antithrombotics/antiplatelets: Secondary | ICD-10-CM | POA: Diagnosis not present

## 2019-12-29 DIAGNOSIS — G4700 Insomnia, unspecified: Secondary | ICD-10-CM | POA: Diagnosis not present

## 2020-01-04 DIAGNOSIS — R1312 Dysphagia, oropharyngeal phase: Secondary | ICD-10-CM | POA: Diagnosis not present

## 2020-01-04 DIAGNOSIS — G309 Alzheimer's disease, unspecified: Secondary | ICD-10-CM | POA: Diagnosis not present

## 2020-01-05 DIAGNOSIS — R1312 Dysphagia, oropharyngeal phase: Secondary | ICD-10-CM | POA: Diagnosis not present

## 2020-01-05 DIAGNOSIS — G309 Alzheimer's disease, unspecified: Secondary | ICD-10-CM | POA: Diagnosis not present

## 2020-01-06 DIAGNOSIS — G309 Alzheimer's disease, unspecified: Secondary | ICD-10-CM | POA: Diagnosis not present

## 2020-01-06 DIAGNOSIS — R1312 Dysphagia, oropharyngeal phase: Secondary | ICD-10-CM | POA: Diagnosis not present

## 2020-01-10 DIAGNOSIS — G309 Alzheimer's disease, unspecified: Secondary | ICD-10-CM | POA: Diagnosis not present

## 2020-01-10 DIAGNOSIS — R1312 Dysphagia, oropharyngeal phase: Secondary | ICD-10-CM | POA: Diagnosis not present

## 2020-01-11 DIAGNOSIS — G309 Alzheimer's disease, unspecified: Secondary | ICD-10-CM | POA: Diagnosis not present

## 2020-01-11 DIAGNOSIS — R1312 Dysphagia, oropharyngeal phase: Secondary | ICD-10-CM | POA: Diagnosis not present

## 2020-01-12 DIAGNOSIS — R1312 Dysphagia, oropharyngeal phase: Secondary | ICD-10-CM | POA: Diagnosis not present

## 2020-01-12 DIAGNOSIS — G309 Alzheimer's disease, unspecified: Secondary | ICD-10-CM | POA: Diagnosis not present

## 2020-01-13 DIAGNOSIS — R1312 Dysphagia, oropharyngeal phase: Secondary | ICD-10-CM | POA: Diagnosis not present

## 2020-01-13 DIAGNOSIS — G309 Alzheimer's disease, unspecified: Secondary | ICD-10-CM | POA: Diagnosis not present

## 2020-01-16 DIAGNOSIS — E039 Hypothyroidism, unspecified: Secondary | ICD-10-CM | POA: Diagnosis not present

## 2020-01-17 DIAGNOSIS — R532 Functional quadriplegia: Secondary | ICD-10-CM | POA: Diagnosis not present

## 2020-01-17 DIAGNOSIS — G309 Alzheimer's disease, unspecified: Secondary | ICD-10-CM | POA: Diagnosis not present

## 2020-01-17 DIAGNOSIS — I739 Peripheral vascular disease, unspecified: Secondary | ICD-10-CM | POA: Diagnosis not present

## 2020-01-17 DIAGNOSIS — Z299 Encounter for prophylactic measures, unspecified: Secondary | ICD-10-CM | POA: Diagnosis not present

## 2020-01-17 DIAGNOSIS — S41111A Laceration without foreign body of right upper arm, initial encounter: Secondary | ICD-10-CM | POA: Diagnosis not present

## 2020-01-17 DIAGNOSIS — R1312 Dysphagia, oropharyngeal phase: Secondary | ICD-10-CM | POA: Diagnosis not present

## 2020-01-18 DIAGNOSIS — G309 Alzheimer's disease, unspecified: Secondary | ICD-10-CM | POA: Diagnosis not present

## 2020-01-18 DIAGNOSIS — R1312 Dysphagia, oropharyngeal phase: Secondary | ICD-10-CM | POA: Diagnosis not present

## 2020-01-19 DIAGNOSIS — E039 Hypothyroidism, unspecified: Secondary | ICD-10-CM | POA: Diagnosis not present

## 2020-01-19 DIAGNOSIS — I10 Essential (primary) hypertension: Secondary | ICD-10-CM | POA: Diagnosis not present

## 2020-01-19 DIAGNOSIS — G309 Alzheimer's disease, unspecified: Secondary | ICD-10-CM | POA: Diagnosis not present

## 2020-01-19 DIAGNOSIS — R1312 Dysphagia, oropharyngeal phase: Secondary | ICD-10-CM | POA: Diagnosis not present

## 2020-01-19 DIAGNOSIS — Z299 Encounter for prophylactic measures, unspecified: Secondary | ICD-10-CM | POA: Diagnosis not present

## 2020-02-09 DIAGNOSIS — G4700 Insomnia, unspecified: Secondary | ICD-10-CM | POA: Diagnosis not present

## 2020-02-14 DIAGNOSIS — G4700 Insomnia, unspecified: Secondary | ICD-10-CM | POA: Diagnosis not present

## 2020-02-14 DIAGNOSIS — I739 Peripheral vascular disease, unspecified: Secondary | ICD-10-CM | POA: Diagnosis not present

## 2020-02-14 DIAGNOSIS — Z299 Encounter for prophylactic measures, unspecified: Secondary | ICD-10-CM | POA: Diagnosis not present

## 2020-02-14 DIAGNOSIS — L8993 Pressure ulcer of unspecified site, stage 3: Secondary | ICD-10-CM | POA: Diagnosis not present

## 2020-02-14 DIAGNOSIS — D692 Other nonthrombocytopenic purpura: Secondary | ICD-10-CM | POA: Diagnosis not present

## 2020-02-28 DIAGNOSIS — M1990 Unspecified osteoarthritis, unspecified site: Secondary | ICD-10-CM | POA: Diagnosis not present

## 2020-02-28 DIAGNOSIS — Z7902 Long term (current) use of antithrombotics/antiplatelets: Secondary | ICD-10-CM | POA: Diagnosis not present

## 2020-02-28 DIAGNOSIS — G309 Alzheimer's disease, unspecified: Secondary | ICD-10-CM | POA: Diagnosis not present

## 2020-02-28 DIAGNOSIS — L89153 Pressure ulcer of sacral region, stage 3: Secondary | ICD-10-CM | POA: Diagnosis not present

## 2020-03-01 DIAGNOSIS — Z23 Encounter for immunization: Secondary | ICD-10-CM | POA: Diagnosis not present

## 2020-03-06 DIAGNOSIS — R600 Localized edema: Secondary | ICD-10-CM | POA: Diagnosis not present

## 2020-03-06 DIAGNOSIS — I739 Peripheral vascular disease, unspecified: Secondary | ICD-10-CM | POA: Diagnosis not present

## 2020-03-06 DIAGNOSIS — B351 Tinea unguium: Secondary | ICD-10-CM | POA: Diagnosis not present

## 2020-03-06 DIAGNOSIS — R262 Difficulty in walking, not elsewhere classified: Secondary | ICD-10-CM | POA: Diagnosis not present

## 2020-03-22 DIAGNOSIS — G4700 Insomnia, unspecified: Secondary | ICD-10-CM | POA: Diagnosis not present

## 2020-05-15 DIAGNOSIS — Z299 Encounter for prophylactic measures, unspecified: Secondary | ICD-10-CM | POA: Diagnosis not present

## 2020-05-15 DIAGNOSIS — L8993 Pressure ulcer of unspecified site, stage 3: Secondary | ICD-10-CM | POA: Diagnosis not present

## 2020-05-15 DIAGNOSIS — R532 Functional quadriplegia: Secondary | ICD-10-CM | POA: Diagnosis not present

## 2020-05-15 DIAGNOSIS — G4700 Insomnia, unspecified: Secondary | ICD-10-CM | POA: Diagnosis not present

## 2020-05-15 DIAGNOSIS — D692 Other nonthrombocytopenic purpura: Secondary | ICD-10-CM | POA: Diagnosis not present

## 2020-05-21 DIAGNOSIS — G4700 Insomnia, unspecified: Secondary | ICD-10-CM | POA: Diagnosis not present

## 2020-06-14 DIAGNOSIS — R532 Functional quadriplegia: Secondary | ICD-10-CM | POA: Diagnosis not present

## 2020-06-14 DIAGNOSIS — I10 Essential (primary) hypertension: Secondary | ICD-10-CM | POA: Diagnosis not present

## 2020-06-14 DIAGNOSIS — Z299 Encounter for prophylactic measures, unspecified: Secondary | ICD-10-CM | POA: Diagnosis not present

## 2020-06-28 DIAGNOSIS — I739 Peripheral vascular disease, unspecified: Secondary | ICD-10-CM | POA: Diagnosis not present

## 2020-06-28 DIAGNOSIS — Z299 Encounter for prophylactic measures, unspecified: Secondary | ICD-10-CM | POA: Diagnosis not present

## 2020-06-28 DIAGNOSIS — R532 Functional quadriplegia: Secondary | ICD-10-CM | POA: Diagnosis not present

## 2020-06-28 DIAGNOSIS — G4700 Insomnia, unspecified: Secondary | ICD-10-CM | POA: Diagnosis not present

## 2020-07-24 DIAGNOSIS — G4700 Insomnia, unspecified: Secondary | ICD-10-CM | POA: Diagnosis not present

## 2020-07-24 DIAGNOSIS — I739 Peripheral vascular disease, unspecified: Secondary | ICD-10-CM | POA: Diagnosis not present

## 2020-07-24 DIAGNOSIS — D692 Other nonthrombocytopenic purpura: Secondary | ICD-10-CM | POA: Diagnosis not present

## 2020-07-24 DIAGNOSIS — Z299 Encounter for prophylactic measures, unspecified: Secondary | ICD-10-CM | POA: Diagnosis not present

## 2020-07-24 DIAGNOSIS — L8993 Pressure ulcer of unspecified site, stage 3: Secondary | ICD-10-CM | POA: Diagnosis not present

## 2020-07-26 DIAGNOSIS — G4700 Insomnia, unspecified: Secondary | ICD-10-CM | POA: Diagnosis not present

## 2020-07-26 DIAGNOSIS — Z593 Problems related to living in residential institution: Secondary | ICD-10-CM | POA: Diagnosis not present

## 2020-07-26 DIAGNOSIS — Z299 Encounter for prophylactic measures, unspecified: Secondary | ICD-10-CM | POA: Diagnosis not present

## 2020-07-26 DIAGNOSIS — R532 Functional quadriplegia: Secondary | ICD-10-CM | POA: Diagnosis not present

## 2020-07-26 DIAGNOSIS — I10 Essential (primary) hypertension: Secondary | ICD-10-CM | POA: Diagnosis not present

## 2020-08-05 DIAGNOSIS — R062 Wheezing: Secondary | ICD-10-CM | POA: Diagnosis not present

## 2020-08-07 DIAGNOSIS — Z299 Encounter for prophylactic measures, unspecified: Secondary | ICD-10-CM | POA: Diagnosis not present

## 2020-08-07 DIAGNOSIS — I10 Essential (primary) hypertension: Secondary | ICD-10-CM | POA: Diagnosis not present

## 2020-08-07 DIAGNOSIS — J90 Pleural effusion, not elsewhere classified: Secondary | ICD-10-CM | POA: Diagnosis not present

## 2020-08-07 DIAGNOSIS — R532 Functional quadriplegia: Secondary | ICD-10-CM | POA: Diagnosis not present

## 2020-08-07 DIAGNOSIS — R0902 Hypoxemia: Secondary | ICD-10-CM | POA: Diagnosis not present

## 2020-08-07 DIAGNOSIS — I2510 Atherosclerotic heart disease of native coronary artery without angina pectoris: Secondary | ICD-10-CM | POA: Diagnosis not present

## 2020-08-07 DIAGNOSIS — M47814 Spondylosis without myelopathy or radiculopathy, thoracic region: Secondary | ICD-10-CM | POA: Diagnosis not present

## 2020-08-07 DIAGNOSIS — I700 Atherosclerosis of aorta: Secondary | ICD-10-CM | POA: Diagnosis not present

## 2020-08-07 DIAGNOSIS — Z7401 Bed confinement status: Secondary | ICD-10-CM | POA: Diagnosis not present

## 2020-08-07 DIAGNOSIS — R404 Transient alteration of awareness: Secondary | ICD-10-CM | POA: Diagnosis not present

## 2020-08-07 DIAGNOSIS — K7689 Other specified diseases of liver: Secondary | ICD-10-CM | POA: Diagnosis not present

## 2020-08-09 DIAGNOSIS — Z299 Encounter for prophylactic measures, unspecified: Secondary | ICD-10-CM | POA: Diagnosis not present

## 2020-08-09 DIAGNOSIS — I10 Essential (primary) hypertension: Secondary | ICD-10-CM | POA: Diagnosis not present

## 2020-08-09 DIAGNOSIS — R532 Functional quadriplegia: Secondary | ICD-10-CM | POA: Diagnosis not present

## 2020-08-09 DIAGNOSIS — J90 Pleural effusion, not elsewhere classified: Secondary | ICD-10-CM | POA: Diagnosis not present

## 2020-08-09 DIAGNOSIS — Z593 Problems related to living in residential institution: Secondary | ICD-10-CM | POA: Diagnosis not present

## 2020-08-21 DIAGNOSIS — Z299 Encounter for prophylactic measures, unspecified: Secondary | ICD-10-CM | POA: Diagnosis not present

## 2020-08-21 DIAGNOSIS — R532 Functional quadriplegia: Secondary | ICD-10-CM | POA: Diagnosis not present

## 2020-08-21 DIAGNOSIS — I700 Atherosclerosis of aorta: Secondary | ICD-10-CM | POA: Diagnosis not present

## 2020-08-31 ENCOUNTER — Institutional Professional Consult (permissible substitution): Payer: Medicare Other | Admitting: Internal Medicine

## 2020-08-31 NOTE — Progress Notes (Deleted)
   Noah Bailey, male    DOB: 01-29-1929, 87 y.o.   MRN: [id number]   Brief patient profile:      History of Present Illness  08/31/2020  Pulmonary/ 1st office eval/ [name] / [facility]  No chief complaint on file.    Dyspnea:  *** Cough: *** Sleep: *** SABA use:   Past Medical History:  Diagnosis Date  . Alzheimer disease   . Arthritis   . BPH (benign prostatic hyperplasia)   . Hearing deficit    right hearing aid  . Lumbosacral spinal stenosis 06/22/2013  . Memory disturbance   . Renal calculi     Outpatient Medications Prior to Visit  Medication Sig Dispense Refill  . acetaminophen (TYLENOL 8 HOUR) 650 MG CR tablet Take 650 mg by mouth at bedtime.    . clopidogrel (PLAVIX) 75 MG tablet Take 75 mg by mouth daily.    . furosemide (LASIX) 40 MG tablet Take 40 mg by mouth daily as needed for fluid or edema.     . gabapentin (NEURONTIN) 300 MG capsule Take 100 mg by mouth at bedtime.    . memantine (NAMENDA) 10 MG tablet Take 10 mg by mouth 2 (two) times daily.    . rivastigmine (EXELON) 3 MG capsule Take 3 mg by mouth 2 (two) times daily.    . tamsulosin (FLOMAX) 0.4 MG CAPS capsule Take 0.4 mg by mouth at bedtime.    . vitamin B-12 (CYANOCOBALAMIN) 1000 MCG tablet Take 1,000 mcg by mouth at bedtime.      No facility-administered medications prior to visit.     Objective:     There were no vitals taken for this visit.         Assessment   No problem-specific Assessment & Plan notes found for this encounter.     Christinia Gully, MD 08/31/2020

## 2020-09-20 DIAGNOSIS — G4700 Insomnia, unspecified: Secondary | ICD-10-CM | POA: Diagnosis not present

## 2020-09-25 DIAGNOSIS — I700 Atherosclerosis of aorta: Secondary | ICD-10-CM | POA: Diagnosis not present

## 2020-09-25 DIAGNOSIS — Z299 Encounter for prophylactic measures, unspecified: Secondary | ICD-10-CM | POA: Diagnosis not present

## 2020-09-25 DIAGNOSIS — I739 Peripheral vascular disease, unspecified: Secondary | ICD-10-CM | POA: Diagnosis not present

## 2020-10-10 DIAGNOSIS — R1312 Dysphagia, oropharyngeal phase: Secondary | ICD-10-CM | POA: Diagnosis not present

## 2020-10-10 DIAGNOSIS — G309 Alzheimer's disease, unspecified: Secondary | ICD-10-CM | POA: Diagnosis not present

## 2020-10-16 DIAGNOSIS — Z299 Encounter for prophylactic measures, unspecified: Secondary | ICD-10-CM | POA: Diagnosis not present

## 2020-10-16 DIAGNOSIS — I700 Atherosclerosis of aorta: Secondary | ICD-10-CM | POA: Diagnosis not present

## 2020-11-13 DIAGNOSIS — I739 Peripheral vascular disease, unspecified: Secondary | ICD-10-CM | POA: Diagnosis not present

## 2020-11-13 DIAGNOSIS — L989 Disorder of the skin and subcutaneous tissue, unspecified: Secondary | ICD-10-CM | POA: Diagnosis not present

## 2020-11-13 DIAGNOSIS — Z299 Encounter for prophylactic measures, unspecified: Secondary | ICD-10-CM | POA: Diagnosis not present

## 2020-11-13 DIAGNOSIS — R532 Functional quadriplegia: Secondary | ICD-10-CM | POA: Diagnosis not present

## 2020-11-13 DIAGNOSIS — I700 Atherosclerosis of aorta: Secondary | ICD-10-CM | POA: Diagnosis not present

## 2020-12-20 DIAGNOSIS — I739 Peripheral vascular disease, unspecified: Secondary | ICD-10-CM | POA: Diagnosis not present

## 2020-12-20 DIAGNOSIS — L03116 Cellulitis of left lower limb: Secondary | ICD-10-CM | POA: Diagnosis not present

## 2020-12-20 DIAGNOSIS — I700 Atherosclerosis of aorta: Secondary | ICD-10-CM | POA: Diagnosis not present

## 2020-12-20 DIAGNOSIS — I10 Essential (primary) hypertension: Secondary | ICD-10-CM | POA: Diagnosis not present

## 2020-12-20 DIAGNOSIS — Z299 Encounter for prophylactic measures, unspecified: Secondary | ICD-10-CM | POA: Diagnosis not present

## 2020-12-20 DIAGNOSIS — E039 Hypothyroidism, unspecified: Secondary | ICD-10-CM | POA: Diagnosis not present

## 2020-12-26 DIAGNOSIS — Z23 Encounter for immunization: Secondary | ICD-10-CM | POA: Diagnosis not present

## 2020-12-27 DIAGNOSIS — L03116 Cellulitis of left lower limb: Secondary | ICD-10-CM | POA: Diagnosis not present

## 2020-12-27 DIAGNOSIS — L89150 Pressure ulcer of sacral region, unstageable: Secondary | ICD-10-CM | POA: Diagnosis not present

## 2020-12-27 DIAGNOSIS — R627 Adult failure to thrive: Secondary | ICD-10-CM | POA: Diagnosis not present

## 2020-12-27 DIAGNOSIS — I10 Essential (primary) hypertension: Secondary | ICD-10-CM | POA: Diagnosis not present

## 2020-12-27 DIAGNOSIS — Z299 Encounter for prophylactic measures, unspecified: Secondary | ICD-10-CM | POA: Diagnosis not present

## 2021-01-03 DIAGNOSIS — R2689 Other abnormalities of gait and mobility: Secondary | ICD-10-CM | POA: Diagnosis not present

## 2021-01-03 DIAGNOSIS — M6281 Muscle weakness (generalized): Secondary | ICD-10-CM | POA: Diagnosis not present

## 2021-01-03 DIAGNOSIS — G309 Alzheimer's disease, unspecified: Secondary | ICD-10-CM | POA: Diagnosis not present

## 2021-01-08 DIAGNOSIS — L8993 Pressure ulcer of unspecified site, stage 3: Secondary | ICD-10-CM | POA: Diagnosis not present

## 2021-01-08 DIAGNOSIS — Z299 Encounter for prophylactic measures, unspecified: Secondary | ICD-10-CM | POA: Diagnosis not present

## 2021-01-08 DIAGNOSIS — L89150 Pressure ulcer of sacral region, unstageable: Secondary | ICD-10-CM | POA: Diagnosis not present

## 2021-02-07 DEATH — deceased
